# Patient Record
Sex: Female | Born: 1950 | Race: White | Hispanic: No | Marital: Married | Smoking: Never smoker
Health system: Southern US, Community
[De-identification: ages and names within clinical notes are randomized; demographics above are authoritative.]

## PROBLEM LIST (undated history)

## (undated) DIAGNOSIS — A4902 Methicillin resistant Staphylococcus aureus infection, unspecified site: Secondary | ICD-10-CM

## (undated) HISTORY — PX: BACK SURGERY: SHX140

## (undated) HISTORY — PX: ABDOMINAL HYSTERECTOMY: SHX81

---

## 1999-11-01 ENCOUNTER — Other Ambulatory Visit: Admission: RE | Admit: 1999-11-01 | Discharge: 1999-11-01 | Payer: Self-pay | Admitting: Obstetrics and Gynecology

## 2002-04-10 ENCOUNTER — Ambulatory Visit (HOSPITAL_COMMUNITY): Admission: RE | Admit: 2002-04-10 | Discharge: 2002-04-10 | Payer: Self-pay | Admitting: Gastroenterology

## 2003-10-21 ENCOUNTER — Other Ambulatory Visit: Admission: RE | Admit: 2003-10-21 | Discharge: 2003-10-21 | Payer: Self-pay | Admitting: Radiology

## 2007-12-29 ENCOUNTER — Encounter: Admission: RE | Admit: 2007-12-29 | Discharge: 2007-12-29 | Payer: Self-pay | Admitting: Neurosurgery

## 2010-04-21 ENCOUNTER — Encounter: Admission: RE | Admit: 2010-04-21 | Discharge: 2010-04-21 | Payer: Self-pay | Admitting: Internal Medicine

## 2010-09-26 ENCOUNTER — Other Ambulatory Visit (HOSPITAL_COMMUNITY): Payer: Self-pay | Admitting: Orthopaedic Surgery

## 2010-09-26 ENCOUNTER — Ambulatory Visit (HOSPITAL_COMMUNITY)
Admission: RE | Admit: 2010-09-26 | Discharge: 2010-09-26 | Disposition: A | Discharge status: Home / Self Care | Payer: BC Managed Care – PPO | Source: Ambulatory Visit | Attending: Orthopaedic Surgery | Admitting: Orthopaedic Surgery

## 2010-09-26 DIAGNOSIS — M545 Low back pain, unspecified: Secondary | ICD-10-CM

## 2010-09-26 DIAGNOSIS — M5126 Other intervertebral disc displacement, lumbar region: Secondary | ICD-10-CM | POA: Insufficient documentation

## 2010-09-26 DIAGNOSIS — M79609 Pain in unspecified limb: Secondary | ICD-10-CM | POA: Insufficient documentation

## 2010-10-19 ENCOUNTER — Encounter (HOSPITAL_COMMUNITY)
Admission: RE | Admit: 2010-10-19 | Discharge: 2010-10-19 | Disposition: A | Discharge status: Home / Self Care | Payer: BC Managed Care – PPO | Source: Ambulatory Visit | Attending: Neurological Surgery | Admitting: Neurological Surgery

## 2010-10-19 LAB — CBC
Hemoglobin: 13.6 g/dL (ref 12.0–15.0)
MCH: 30.4 pg (ref 26.0–34.0)
MCHC: 33.7 g/dL (ref 30.0–36.0)
MCV: 90 fL (ref 78.0–100.0)
RBC: 4.48 MIL/uL (ref 3.87–5.11)

## 2010-10-19 LAB — BASIC METABOLIC PANEL
BUN: 11 mg/dL (ref 6–23)
CO2: 32 mEq/L (ref 19–32)
Calcium: 10.6 mg/dL — ABNORMAL HIGH (ref 8.4–10.5)
Chloride: 102 mEq/L (ref 96–112)
Creatinine, Ser: 0.77 mg/dL (ref 0.4–1.2)
Glucose, Bld: 145 mg/dL — ABNORMAL HIGH (ref 70–99)

## 2010-10-20 ENCOUNTER — Ambulatory Visit (HOSPITAL_COMMUNITY)
Admission: RE | Admit: 2010-10-20 | Discharge: 2010-10-20 | Disposition: A | Discharge status: Home / Self Care | Payer: BC Managed Care – PPO | Source: Ambulatory Visit | Attending: Neurological Surgery | Admitting: Neurological Surgery

## 2010-10-20 ENCOUNTER — Inpatient Hospital Stay (HOSPITAL_COMMUNITY): Payer: BC Managed Care – PPO

## 2010-10-20 DIAGNOSIS — M5126 Other intervertebral disc displacement, lumbar region: Secondary | ICD-10-CM | POA: Insufficient documentation

## 2010-10-20 DIAGNOSIS — F411 Generalized anxiety disorder: Secondary | ICD-10-CM | POA: Insufficient documentation

## 2010-10-20 DIAGNOSIS — Z01818 Encounter for other preprocedural examination: Secondary | ICD-10-CM | POA: Insufficient documentation

## 2010-10-20 DIAGNOSIS — Z01812 Encounter for preprocedural laboratory examination: Secondary | ICD-10-CM | POA: Insufficient documentation

## 2010-10-25 NOTE — Op Note (Signed)
NAMEJENSINE, Hannah Salazar                    ACCOUNT NO.:  [id number]  MEDICAL RECORD NO.:  [id number]          PATIENT TYPE:  LOCATION:                                 FACILITY:  PHYSICIAN:  Stefani Dama, M.D.       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  PROCEDURE:  Microdiskectomy L5-S1 left with operating microscope microdissection technique.  SURGEON:  Stefani Dama, MD  FIRST ASSISTANT:  Cristi Loron, MD  ANESTHESIA:  General endotracheal.  INDICATIONS:  Hannah Salazar is a 60 year old individual who has had significant back and left lower extremity pain having failed efforts at conservative management over the past couple of months.  She was advised regarding surgical microdiskectomy.  Because the pain has been unrelenting, is bothering her lifestyle.  Substantially, she agreed with proceeding with microdiskectomy.  PROCEDURE:  The patient was brought to the operating room supine on the stretcher.  After smooth induction of general endotracheal anesthesia, she was turned prone.  The back was prepped with alcohol and DuraPrep, and draped in sterile fashion.  X-ray localization of L5-S1 was obtained first with needle placement, and a second x-ray confirmed placement when the dissection was taken down to the interlaminar space.  Laminotomy was performed removing the inferior margin lamina of L5 out to the medial wall facet.  Yellow ligament was taken up in this area.  The common dural tube was then identified, and the takeoff of the S1 nerve was noted be bowed substantially over significant mass.  When this was gradually mobilized, mass was noted to be the nerve root of S1.  This could be mobilized off of  mass, which was in fact a fragment of disk material.  Further dissection yielded, a singular fragment of disk material in the epidural space.  There was some contiguity with an opening into the intradiskal space.  However, this was fairly well fibrosed over.  Area was probed and no other fragments of disk could be identified in this region, and the path for the S1 nerve root was well decompressed.  The nerve root lied flat over the foraminal area and over the inter-disk space once the disk fragment was removed.  Hemostasis was carefully then checked.  The area was inspected for any other disk material or areas of stenosis.  None were identified and at this point, wound was irrigated copiously with antibiotic irrigating solution, and the lumbodorsal fascia was closed with #1 Vicryl in interrupted fashion, 2-0 Vicryl was used in subcutaneous tissues.  20 mL of 0.5% Marcaine was injected into the paraspinous musculature and fascia at this point, and then the subcuticular tissue was closed with 3-0 Vicryl inverted interrupted fashion.  The patient tolerated the procedure well.  Blood loss was essentially nil.     Stefani Dama, M.D.     [name]  D:  10/20/2010  T:  10/21/2010  Job:  [id number]  Electronically Signed by Barnett Abu M.D. on 10/25/2010 09:31:11 AM

## 2010-12-30 NOTE — Op Note (Signed)
NAME:  Hannah Salazar, Hannah Salazar                              ACCOUNT NO.:  [id number]   MEDICAL RECORD NO.:  [id number]                   PATIENT TYPE:  AMB   LOCATION:  ENDO                                 FACILITY:  [facility]   PHYSICIAN:  Charna Elizabeth, M.D.                   DATE OF BIRTH:  [birth year]   DATE OF PROCEDURE:  04/10/2002  DATE OF DISCHARGE:                                 OPERATIVE REPORT   PROCEDURE:  Screening colonoscopy.   ENDOSCOPIST:  Charna Elizabeth, M.D.   INSTRUMENT USED:  Olympus video colonoscope.   INDICATIONS:  Occasional BRBPR with recent change in bowel habits and  worsening constipation in a 60 year old white female.  Rule out colonic  polyps, masses, hemorrhoids, etc.   PREPROCEDURE PREPARATION:  Informed consent was procured from the patient.  The patient had fasted for eight hours prior to the procedure and was  prepped with a bottle of magnesium citrate and a gallon of NuLytely the  night prior to the procedure.   PREPROCEDURE PHYSICAL:  VITAL SIGNS:  The patient had stable vital signs.  NECK:  Supple.  CHEST:  Clear to auscultation.  S1, S2 regular.  ABDOMEN:  Soft with normal bowel sounds.   DESCRIPTION OF PROCEDURE:  The patient was placed in the left lateral  decubitus position and sedated with 60 mg of Demerol and 6 mg of Versed  intravenously.  Once the patient was adequately sedate and maintained on low-  flow oxygen and continuous cardiac monitoring, the Olympus video colonoscope  was advanced from the rectum to the cecum and terminal ileum without  difficulty.  The entire colonic mucosa and the terminal ileum appeared  normal with no evidence of polyps, masses, erosions, ulcerations, or  diverticular disease.  The appendiceal orifice and the ileocecal valve were  clearly visualized and photographed.  A small external hemorrhoid was seen  on withdrawal of the scope.  Retroflexion revealed no evidence of internal  hemorrhoids or rectal lesions.   IMPRESSION:  1. Small external hemorrhoid.  2. Otherwise normal colonoscopy up to the terminal ileum.   RECOMMENDATIONS:  1. A high-fiber diet with liberal fluid intake has been advocated; 15-20 g     of fiber in the diet has been advised.  2.     Outpatient follow-up in the next two weeks or earlier if need be.  3. Repeat colorectal cancer screening in the next five years unless the     patient develops any abnormal symptoms in the interim.                                               Charna Elizabeth, M.D.    JM/MEDQ  D:  04/10/2002  T:  04/14/2002  Job:  [id number]   cc:   Heather Roberts, M.D.

## 2011-04-03 ENCOUNTER — Other Ambulatory Visit: Payer: Self-pay | Admitting: Specialist

## 2011-04-03 DIAGNOSIS — Z1231 Encounter for screening mammogram for malignant neoplasm of breast: Secondary | ICD-10-CM

## 2011-04-27 ENCOUNTER — Other Ambulatory Visit: Payer: Self-pay | Admitting: Internal Medicine

## 2011-04-27 ENCOUNTER — Ambulatory Visit
Admission: RE | Admit: 2011-04-27 | Discharge: 2011-04-27 | Disposition: A | Discharge status: Home / Self Care | Payer: BC Managed Care – PPO | Source: Ambulatory Visit | Attending: Specialist | Admitting: Specialist

## 2011-04-27 DIAGNOSIS — Z1231 Encounter for screening mammogram for malignant neoplasm of breast: Secondary | ICD-10-CM

## 2012-04-22 ENCOUNTER — Other Ambulatory Visit: Payer: Self-pay | Admitting: Internal Medicine

## 2012-04-30 ENCOUNTER — Other Ambulatory Visit: Payer: Self-pay | Admitting: Internal Medicine

## 2012-04-30 DIAGNOSIS — Z1231 Encounter for screening mammogram for malignant neoplasm of breast: Secondary | ICD-10-CM

## 2012-05-20 ENCOUNTER — Ambulatory Visit
Admission: RE | Admit: 2012-05-20 | Discharge: 2012-05-20 | Disposition: A | Discharge status: Home / Self Care | Payer: BC Managed Care – PPO | Source: Ambulatory Visit | Attending: Internal Medicine | Admitting: Internal Medicine

## 2012-05-20 DIAGNOSIS — Z1231 Encounter for screening mammogram for malignant neoplasm of breast: Secondary | ICD-10-CM

## 2013-05-14 ENCOUNTER — Other Ambulatory Visit: Payer: Self-pay

## 2013-05-14 DIAGNOSIS — Z1231 Encounter for screening mammogram for malignant neoplasm of breast: Secondary | ICD-10-CM

## 2013-05-27 ENCOUNTER — Ambulatory Visit
Admission: RE | Admit: 2013-05-27 | Discharge: 2013-05-27 | Disposition: A | Discharge status: Home / Self Care | Payer: PRIVATE HEALTH INSURANCE | Source: Ambulatory Visit

## 2013-05-27 DIAGNOSIS — Z1231 Encounter for screening mammogram for malignant neoplasm of breast: Secondary | ICD-10-CM

## 2013-12-29 ENCOUNTER — Ambulatory Visit (HOSPITAL_COMMUNITY)
Admission: RE | Admit: 2013-12-29 | Discharge: 2013-12-29 | Disposition: A | Discharge status: Home / Self Care | Payer: PRIVATE HEALTH INSURANCE | Source: Ambulatory Visit | Attending: Surgery | Admitting: Surgery

## 2013-12-29 ENCOUNTER — Other Ambulatory Visit (HOSPITAL_COMMUNITY): Payer: Self-pay | Admitting: Internal Medicine

## 2013-12-29 DIAGNOSIS — M7989 Other specified soft tissue disorders: Secondary | ICD-10-CM

## 2013-12-29 NOTE — Progress Notes (Signed)
Preliminary results given to [facility] at [facility] via phone.

## 2014-04-28 ENCOUNTER — Other Ambulatory Visit: Payer: Self-pay

## 2014-04-28 DIAGNOSIS — Z1231 Encounter for screening mammogram for malignant neoplasm of breast: Secondary | ICD-10-CM

## 2014-05-15 DIAGNOSIS — M25511 Pain in right shoulder: Secondary | ICD-10-CM | POA: Diagnosis not present

## 2014-05-15 DIAGNOSIS — M7501 Adhesive capsulitis of right shoulder: Secondary | ICD-10-CM | POA: Diagnosis not present

## 2014-05-21 DIAGNOSIS — M25511 Pain in right shoulder: Secondary | ICD-10-CM | POA: Diagnosis not present

## 2014-05-21 DIAGNOSIS — M7501 Adhesive capsulitis of right shoulder: Secondary | ICD-10-CM | POA: Diagnosis not present

## 2014-05-25 DIAGNOSIS — M25511 Pain in right shoulder: Secondary | ICD-10-CM | POA: Diagnosis not present

## 2014-05-25 DIAGNOSIS — M7501 Adhesive capsulitis of right shoulder: Secondary | ICD-10-CM | POA: Diagnosis not present

## 2014-06-03 DIAGNOSIS — M7501 Adhesive capsulitis of right shoulder: Secondary | ICD-10-CM | POA: Diagnosis not present

## 2014-06-03 DIAGNOSIS — M25511 Pain in right shoulder: Secondary | ICD-10-CM | POA: Diagnosis not present

## 2014-06-05 ENCOUNTER — Ambulatory Visit
Admission: RE | Admit: 2014-06-05 | Discharge: 2014-06-05 | Disposition: A | Discharge status: Home / Self Care | Payer: Medicare Other | Source: Ambulatory Visit

## 2014-06-05 DIAGNOSIS — Z1231 Encounter for screening mammogram for malignant neoplasm of breast: Secondary | ICD-10-CM

## 2014-06-09 DIAGNOSIS — Z23 Encounter for immunization: Secondary | ICD-10-CM | POA: Diagnosis not present

## 2014-06-22 DIAGNOSIS — L821 Other seborrheic keratosis: Secondary | ICD-10-CM | POA: Diagnosis not present

## 2014-06-22 DIAGNOSIS — M7501 Adhesive capsulitis of right shoulder: Secondary | ICD-10-CM | POA: Diagnosis not present

## 2014-06-22 DIAGNOSIS — M25511 Pain in right shoulder: Secondary | ICD-10-CM | POA: Diagnosis not present

## 2014-06-22 DIAGNOSIS — D225 Melanocytic nevi of trunk: Secondary | ICD-10-CM | POA: Diagnosis not present

## 2014-06-22 DIAGNOSIS — D485 Neoplasm of uncertain behavior of skin: Secondary | ICD-10-CM | POA: Diagnosis not present

## 2014-08-11 DIAGNOSIS — I788 Other diseases of capillaries: Secondary | ICD-10-CM | POA: Diagnosis not present

## 2014-08-11 DIAGNOSIS — L718 Other rosacea: Secondary | ICD-10-CM | POA: Diagnosis not present

## 2014-12-04 DIAGNOSIS — R32 Unspecified urinary incontinence: Secondary | ICD-10-CM | POA: Diagnosis not present

## 2014-12-04 DIAGNOSIS — E785 Hyperlipidemia, unspecified: Secondary | ICD-10-CM | POA: Diagnosis not present

## 2014-12-04 DIAGNOSIS — I341 Nonrheumatic mitral (valve) prolapse: Secondary | ICD-10-CM | POA: Diagnosis not present

## 2014-12-04 DIAGNOSIS — M47817 Spondylosis without myelopathy or radiculopathy, lumbosacral region: Secondary | ICD-10-CM | POA: Diagnosis not present

## 2014-12-04 DIAGNOSIS — Z6823 Body mass index (BMI) 23.0-23.9, adult: Secondary | ICD-10-CM | POA: Diagnosis not present

## 2015-02-02 DIAGNOSIS — L820 Inflamed seborrheic keratosis: Secondary | ICD-10-CM | POA: Diagnosis not present

## 2015-02-26 DIAGNOSIS — E785 Hyperlipidemia, unspecified: Secondary | ICD-10-CM | POA: Diagnosis not present

## 2015-02-26 DIAGNOSIS — R8290 Unspecified abnormal findings in urine: Secondary | ICD-10-CM | POA: Diagnosis not present

## 2015-02-26 DIAGNOSIS — N390 Urinary tract infection, site not specified: Secondary | ICD-10-CM | POA: Diagnosis not present

## 2015-03-02 DIAGNOSIS — M47817 Spondylosis without myelopathy or radiculopathy, lumbosacral region: Secondary | ICD-10-CM | POA: Diagnosis not present

## 2015-03-02 DIAGNOSIS — Z961 Presence of intraocular lens: Secondary | ICD-10-CM | POA: Diagnosis not present

## 2015-03-02 DIAGNOSIS — Z0000 Encounter for general adult medical examination without abnormal findings: Secondary | ICD-10-CM | POA: Diagnosis not present

## 2015-03-02 DIAGNOSIS — Z23 Encounter for immunization: Secondary | ICD-10-CM | POA: Diagnosis not present

## 2015-03-02 DIAGNOSIS — I341 Nonrheumatic mitral (valve) prolapse: Secondary | ICD-10-CM | POA: Diagnosis not present

## 2015-03-02 DIAGNOSIS — E785 Hyperlipidemia, unspecified: Secondary | ICD-10-CM | POA: Diagnosis not present

## 2015-03-02 DIAGNOSIS — Z1212 Encounter for screening for malignant neoplasm of rectum: Secondary | ICD-10-CM | POA: Diagnosis not present

## 2015-05-17 ENCOUNTER — Other Ambulatory Visit: Payer: Self-pay

## 2015-05-17 DIAGNOSIS — Z1231 Encounter for screening mammogram for malignant neoplasm of breast: Secondary | ICD-10-CM

## 2015-05-25 DIAGNOSIS — Z23 Encounter for immunization: Secondary | ICD-10-CM | POA: Diagnosis not present

## 2015-06-07 ENCOUNTER — Ambulatory Visit: Payer: Medicare Other

## 2015-06-15 DIAGNOSIS — D2211 Melanocytic nevi of right eyelid, including canthus: Secondary | ICD-10-CM | POA: Diagnosis not present

## 2015-06-15 DIAGNOSIS — L821 Other seborrheic keratosis: Secondary | ICD-10-CM | POA: Diagnosis not present

## 2015-06-15 DIAGNOSIS — D2261 Melanocytic nevi of right upper limb, including shoulder: Secondary | ICD-10-CM | POA: Diagnosis not present

## 2015-06-15 DIAGNOSIS — L820 Inflamed seborrheic keratosis: Secondary | ICD-10-CM | POA: Diagnosis not present

## 2015-06-15 DIAGNOSIS — D2372 Other benign neoplasm of skin of left lower limb, including hip: Secondary | ICD-10-CM | POA: Diagnosis not present

## 2015-06-15 DIAGNOSIS — D225 Melanocytic nevi of trunk: Secondary | ICD-10-CM | POA: Diagnosis not present

## 2015-06-15 DIAGNOSIS — D1801 Hemangioma of skin and subcutaneous tissue: Secondary | ICD-10-CM | POA: Diagnosis not present

## 2015-06-15 DIAGNOSIS — D2239 Melanocytic nevi of other parts of face: Secondary | ICD-10-CM | POA: Diagnosis not present

## 2015-07-02 ENCOUNTER — Ambulatory Visit: Payer: Medicare Other

## 2015-07-19 ENCOUNTER — Ambulatory Visit
Admission: RE | Admit: 2015-07-19 | Discharge: 2015-07-19 | Disposition: A | Discharge status: Home / Self Care | Payer: Medicare Other | Source: Ambulatory Visit

## 2015-07-19 DIAGNOSIS — Z1231 Encounter for screening mammogram for malignant neoplasm of breast: Secondary | ICD-10-CM

## 2015-10-18 DIAGNOSIS — H43811 Vitreous degeneration, right eye: Secondary | ICD-10-CM | POA: Diagnosis not present

## 2015-11-22 DIAGNOSIS — H43811 Vitreous degeneration, right eye: Secondary | ICD-10-CM | POA: Diagnosis not present

## 2015-11-28 DIAGNOSIS — S40811A Abrasion of right upper arm, initial encounter: Secondary | ICD-10-CM | POA: Diagnosis not present

## 2015-11-28 DIAGNOSIS — Z23 Encounter for immunization: Secondary | ICD-10-CM | POA: Diagnosis not present

## 2016-03-01 DIAGNOSIS — H5212 Myopia, left eye: Secondary | ICD-10-CM | POA: Diagnosis not present

## 2016-03-01 DIAGNOSIS — H26492 Other secondary cataract, left eye: Secondary | ICD-10-CM | POA: Diagnosis not present

## 2016-05-10 DIAGNOSIS — Z0000 Encounter for general adult medical examination without abnormal findings: Secondary | ICD-10-CM | POA: Diagnosis not present

## 2016-05-10 DIAGNOSIS — Z780 Asymptomatic menopausal state: Secondary | ICD-10-CM | POA: Diagnosis not present

## 2016-05-10 DIAGNOSIS — E784 Other hyperlipidemia: Secondary | ICD-10-CM | POA: Diagnosis not present

## 2016-05-19 DIAGNOSIS — M47817 Spondylosis without myelopathy or radiculopathy, lumbosacral region: Secondary | ICD-10-CM | POA: Diagnosis not present

## 2016-05-19 DIAGNOSIS — Z23 Encounter for immunization: Secondary | ICD-10-CM | POA: Diagnosis not present

## 2016-05-19 DIAGNOSIS — Z0000 Encounter for general adult medical examination without abnormal findings: Secondary | ICD-10-CM | POA: Diagnosis not present

## 2016-05-19 DIAGNOSIS — Z6822 Body mass index (BMI) 22.0-22.9, adult: Secondary | ICD-10-CM | POA: Diagnosis not present

## 2016-05-19 DIAGNOSIS — E784 Other hyperlipidemia: Secondary | ICD-10-CM | POA: Diagnosis not present

## 2016-05-19 DIAGNOSIS — R32 Unspecified urinary incontinence: Secondary | ICD-10-CM | POA: Diagnosis not present

## 2016-06-06 DIAGNOSIS — Z23 Encounter for immunization: Secondary | ICD-10-CM | POA: Diagnosis not present

## 2016-06-13 ENCOUNTER — Other Ambulatory Visit: Payer: Self-pay | Admitting: Internal Medicine

## 2016-06-13 DIAGNOSIS — Z1231 Encounter for screening mammogram for malignant neoplasm of breast: Secondary | ICD-10-CM

## 2016-07-04 DIAGNOSIS — Z23 Encounter for immunization: Secondary | ICD-10-CM | POA: Diagnosis not present

## 2016-07-21 ENCOUNTER — Ambulatory Visit
Admission: RE | Admit: 2016-07-21 | Discharge: 2016-07-21 | Disposition: A | Discharge status: Home / Self Care | Payer: Medicare Other | Source: Ambulatory Visit | Attending: Internal Medicine | Admitting: Internal Medicine

## 2016-07-21 DIAGNOSIS — Z1231 Encounter for screening mammogram for malignant neoplasm of breast: Secondary | ICD-10-CM | POA: Diagnosis not present

## 2017-02-19 DIAGNOSIS — L821 Other seborrheic keratosis: Secondary | ICD-10-CM | POA: Diagnosis not present

## 2017-02-19 DIAGNOSIS — L918 Other hypertrophic disorders of the skin: Secondary | ICD-10-CM | POA: Diagnosis not present

## 2017-02-19 DIAGNOSIS — D1801 Hemangioma of skin and subcutaneous tissue: Secondary | ICD-10-CM | POA: Diagnosis not present

## 2017-02-19 DIAGNOSIS — D2261 Melanocytic nevi of right upper limb, including shoulder: Secondary | ICD-10-CM | POA: Diagnosis not present

## 2017-02-19 DIAGNOSIS — L819 Disorder of pigmentation, unspecified: Secondary | ICD-10-CM | POA: Diagnosis not present

## 2017-02-19 DIAGNOSIS — D225 Melanocytic nevi of trunk: Secondary | ICD-10-CM | POA: Diagnosis not present

## 2017-02-19 DIAGNOSIS — D2372 Other benign neoplasm of skin of left lower limb, including hip: Secondary | ICD-10-CM | POA: Diagnosis not present

## 2017-04-25 DIAGNOSIS — H00015 Hordeolum externum left lower eyelid: Secondary | ICD-10-CM | POA: Diagnosis not present

## 2017-05-03 DIAGNOSIS — H01001 Unspecified blepharitis right upper eyelid: Secondary | ICD-10-CM | POA: Diagnosis not present

## 2017-05-03 DIAGNOSIS — H0015 Chalazion left lower eyelid: Secondary | ICD-10-CM | POA: Diagnosis not present

## 2017-05-03 DIAGNOSIS — H01004 Unspecified blepharitis left upper eyelid: Secondary | ICD-10-CM | POA: Diagnosis not present

## 2017-05-11 DIAGNOSIS — Z23 Encounter for immunization: Secondary | ICD-10-CM | POA: Diagnosis not present

## 2017-05-17 DIAGNOSIS — Z0000 Encounter for general adult medical examination without abnormal findings: Secondary | ICD-10-CM | POA: Diagnosis not present

## 2017-05-17 DIAGNOSIS — E7849 Other hyperlipidemia: Secondary | ICD-10-CM | POA: Diagnosis not present

## 2017-05-21 DIAGNOSIS — H0014 Chalazion left upper eyelid: Secondary | ICD-10-CM | POA: Diagnosis not present

## 2017-05-21 DIAGNOSIS — H0015 Chalazion left lower eyelid: Secondary | ICD-10-CM | POA: Diagnosis not present

## 2017-05-23 DIAGNOSIS — Z0000 Encounter for general adult medical examination without abnormal findings: Secondary | ICD-10-CM | POA: Diagnosis not present

## 2017-05-23 DIAGNOSIS — R600 Localized edema: Secondary | ICD-10-CM | POA: Diagnosis not present

## 2017-05-23 DIAGNOSIS — Z1389 Encounter for screening for other disorder: Secondary | ICD-10-CM | POA: Diagnosis not present

## 2017-05-23 DIAGNOSIS — M47817 Spondylosis without myelopathy or radiculopathy, lumbosacral region: Secondary | ICD-10-CM | POA: Diagnosis not present

## 2017-05-23 DIAGNOSIS — Z6822 Body mass index (BMI) 22.0-22.9, adult: Secondary | ICD-10-CM | POA: Diagnosis not present

## 2017-05-23 DIAGNOSIS — H0016 Chalazion left eye, unspecified eyelid: Secondary | ICD-10-CM | POA: Diagnosis not present

## 2017-05-23 DIAGNOSIS — E7849 Other hyperlipidemia: Secondary | ICD-10-CM | POA: Diagnosis not present

## 2017-05-23 DIAGNOSIS — I341 Nonrheumatic mitral (valve) prolapse: Secondary | ICD-10-CM | POA: Diagnosis not present

## 2017-06-01 DIAGNOSIS — Z1212 Encounter for screening for malignant neoplasm of rectum: Secondary | ICD-10-CM | POA: Diagnosis not present

## 2017-06-11 ENCOUNTER — Other Ambulatory Visit: Payer: Self-pay | Admitting: Internal Medicine

## 2017-06-11 DIAGNOSIS — Z1231 Encounter for screening mammogram for malignant neoplasm of breast: Secondary | ICD-10-CM

## 2017-06-19 DIAGNOSIS — H26492 Other secondary cataract, left eye: Secondary | ICD-10-CM | POA: Diagnosis not present

## 2017-07-02 ENCOUNTER — Other Ambulatory Visit: Payer: Self-pay | Admitting: Internal Medicine

## 2017-07-02 DIAGNOSIS — M810 Age-related osteoporosis without current pathological fracture: Secondary | ICD-10-CM

## 2017-07-02 DIAGNOSIS — R5381 Other malaise: Secondary | ICD-10-CM

## 2017-07-02 DIAGNOSIS — Z13820 Encounter for screening for osteoporosis: Secondary | ICD-10-CM

## 2017-07-27 ENCOUNTER — Ambulatory Visit: Payer: Medicare Other

## 2017-08-01 ENCOUNTER — Ambulatory Visit
Admission: RE | Admit: 2017-08-01 | Discharge: 2017-08-01 | Disposition: A | Discharge status: Home / Self Care | Payer: Medicare Other | Source: Ambulatory Visit | Attending: Internal Medicine | Admitting: Internal Medicine

## 2017-08-01 DIAGNOSIS — M810 Age-related osteoporosis without current pathological fracture: Secondary | ICD-10-CM

## 2017-08-01 DIAGNOSIS — M8588 Other specified disorders of bone density and structure, other site: Secondary | ICD-10-CM | POA: Diagnosis not present

## 2017-08-01 DIAGNOSIS — Z780 Asymptomatic menopausal state: Secondary | ICD-10-CM | POA: Diagnosis not present

## 2017-08-01 DIAGNOSIS — Z1231 Encounter for screening mammogram for malignant neoplasm of breast: Secondary | ICD-10-CM | POA: Diagnosis not present

## 2017-08-02 DIAGNOSIS — H26492 Other secondary cataract, left eye: Secondary | ICD-10-CM | POA: Diagnosis not present

## 2017-09-09 DIAGNOSIS — H0011 Chalazion right upper eyelid: Secondary | ICD-10-CM | POA: Diagnosis not present

## 2017-09-09 DIAGNOSIS — H5711 Ocular pain, right eye: Secondary | ICD-10-CM | POA: Diagnosis not present

## 2017-09-09 DIAGNOSIS — L03213 Periorbital cellulitis: Secondary | ICD-10-CM | POA: Diagnosis not present

## 2017-09-10 DIAGNOSIS — H0011 Chalazion right upper eyelid: Secondary | ICD-10-CM | POA: Diagnosis not present

## 2017-09-10 DIAGNOSIS — L03213 Periorbital cellulitis: Secondary | ICD-10-CM | POA: Diagnosis not present

## 2018-02-19 ENCOUNTER — Ambulatory Visit (INDEPENDENT_AMBULATORY_CARE_PROVIDER_SITE_OTHER): Payer: Medicare Other

## 2018-02-19 ENCOUNTER — Encounter (INDEPENDENT_AMBULATORY_CARE_PROVIDER_SITE_OTHER): Payer: Self-pay | Admitting: Orthopaedic Surgery

## 2018-02-19 ENCOUNTER — Ambulatory Visit (INDEPENDENT_AMBULATORY_CARE_PROVIDER_SITE_OTHER): Payer: Medicare Other | Admitting: Orthopaedic Surgery

## 2018-02-19 VITALS — BP 120/67 | HR 63 | Ht 69.5 in | Wt 150.0 lb

## 2018-02-19 DIAGNOSIS — M7061 Trochanteric bursitis, right hip: Secondary | ICD-10-CM

## 2018-02-19 DIAGNOSIS — M25551 Pain in right hip: Secondary | ICD-10-CM

## 2018-02-19 NOTE — Progress Notes (Signed)
Office Visit Note   Patient: Hannah Salazar           Date of Birth: 02/03/1951           MRN: [id number] Visit Date: 02/19/2018              Requested by: Hannah Bunting, MD [address] [place], [place] [address] PCP: Hannah Bunting, MD   Assessment & Plan: Visit Diagnoses:  1. Pain in right hip   2. Trochanteric bursitis, right hip     Plan: Greater trochanteric bursitis right hip.  Long discussion regarding treatment options.  Presently minimally symptomatic.  Can use ice or heat and over-the-counter NSAIDs.  Consider cortisone injection if there is an exacerbation.  I do not think this is referred from her lumbar spine  Follow-Up Instructions: Return if symptoms worsen or fail to improve.   Orders:  Orders Placed This Encounter  Procedures  . XR Pelvis 1-2 Views   No orders of the defined types were placed in this encounter.     Procedures: No procedures performed   Clinical Data: No additional findings.   Subjective: Chief Complaint  Patient presents with  . New Patient (Initial Visit)    R HIP PAIN THINKS IT MAY BE BURSITIS HAS HAD PRIOR, THIS TIME SX HAVE BEEN GOING ON FOR 6-8 WKS OFF AND ON NAD HAS SOME LEFT HIP PAIN OCC  Hannah Salazar relates that she is been having some pain along the lateral aspect of her right hip over the past 6 to 8 weeks.  She denies any injury or trauma.  Pain originates in the lateral aspect of her hip and radiates along the lateral thigh as far distally as the knee.  She has not experienced any groin pain.  Is not been any numbness or tingling.  She does have a history of problems with her lumbar spine.  Many years ago she had a discectomy at L5-S1 on the left by Dr. Ellene Salazar.  He did have an MRI scan in 2012 demonstrating some bulging disks at other levels.  HPI  Review of Systems  Constitutional: Negative for fatigue and fever.  HENT: Negative for ear pain.   Eyes: Negative for pain.  Respiratory: Negative for cough and shortness of  breath.   Cardiovascular: Positive for leg swelling.  Gastrointestinal: Negative for constipation and diarrhea.  Genitourinary: Negative for difficulty urinating.  Musculoskeletal: Negative for back pain and neck pain.  Skin: Negative for rash.  Allergic/Immunologic: Negative for food allergies.  Neurological: Positive for weakness. Negative for numbness.  Hematological: Does not bruise/bleed easily.  Psychiatric/Behavioral: Positive for sleep disturbance.     Objective: Vital Signs: BP 120/67 (BP Location: Right Arm, Patient Position: Sitting, Cuff Size: Normal)   Pulse 63   Ht 5' 9.5" (1.765 m)   Wt 150 lb (68 kg)   BMI 21.83 kg/m   Physical Exam  Constitutional: She is oriented to person, place, and time. She appears well-developed and well-nourished.  HENT:  Mouth/Throat: Oropharynx is clear and moist.  Eyes: Pupils are equal, round, and reactive to light. EOM are normal.  Pulmonary/Chest: Effort normal.  Neurological: She is alert and oriented to person, place, and time.  Skin: Skin is warm and dry.  Psychiatric: She has a normal mood and affect. Her behavior is normal.    Ortho Exam awake alert and oriented x3.  Comfortable sitting.  Straight leg raise negative bilaterally.  No pain over the greater trochanter of the right hip.  No pain over the greater trochanter on the left.  Painless range of motion of both hips.  No percussible back pain.  Walks without a limp.  No groin discomfort skin intact.  Good pulses.  No distal edema  Specialty Comments:  No specialty comments available.  Imaging: Xr Pelvis 1-2 Views  Result Date: 02/19/2018 AP the pelvis demonstrates some irregularity along the greater trochanter of the right greater than left hip.  This could be consistent with a bursitis.  the hip joints appear to be well maintained.  Some calcification along the lateral aspect of the acetabulum but no obvious osteoarthritis.    PMFS History: There are no active  problems to display for this patient.  History reviewed. No pertinent past medical history.  History reviewed. No pertinent family history.  Past Surgical History:  Procedure Laterality Date  . ABDOMINAL HYSTERECTOMY    . BACK SURGERY     Social History   Occupational History  . Not on file  Tobacco Use  . Smoking status: Never Smoker  . Smokeless tobacco: Never Used  Substance and Sexual Activity  . Alcohol use: Yes  . Drug use: Not Currently  . Sexual activity: Not on file

## 2018-04-02 DIAGNOSIS — L821 Other seborrheic keratosis: Secondary | ICD-10-CM | POA: Diagnosis not present

## 2018-04-02 DIAGNOSIS — D225 Melanocytic nevi of trunk: Secondary | ICD-10-CM | POA: Diagnosis not present

## 2018-04-02 DIAGNOSIS — D2261 Melanocytic nevi of right upper limb, including shoulder: Secondary | ICD-10-CM | POA: Diagnosis not present

## 2018-04-02 DIAGNOSIS — D2272 Melanocytic nevi of left lower limb, including hip: Secondary | ICD-10-CM | POA: Diagnosis not present

## 2018-04-02 DIAGNOSIS — D2239 Melanocytic nevi of other parts of face: Secondary | ICD-10-CM | POA: Diagnosis not present

## 2018-04-02 DIAGNOSIS — D1801 Hemangioma of skin and subcutaneous tissue: Secondary | ICD-10-CM | POA: Diagnosis not present

## 2018-04-02 DIAGNOSIS — C44319 Basal cell carcinoma of skin of other parts of face: Secondary | ICD-10-CM | POA: Diagnosis not present

## 2018-04-18 DIAGNOSIS — H5711 Ocular pain, right eye: Secondary | ICD-10-CM | POA: Diagnosis not present

## 2018-04-30 DIAGNOSIS — C44319 Basal cell carcinoma of skin of other parts of face: Secondary | ICD-10-CM | POA: Diagnosis not present

## 2018-04-30 DIAGNOSIS — Z85828 Personal history of other malignant neoplasm of skin: Secondary | ICD-10-CM | POA: Diagnosis not present

## 2018-05-13 DIAGNOSIS — Z6822 Body mass index (BMI) 22.0-22.9, adult: Secondary | ICD-10-CM | POA: Diagnosis not present

## 2018-05-13 DIAGNOSIS — J189 Pneumonia, unspecified organism: Secondary | ICD-10-CM | POA: Diagnosis not present

## 2018-05-13 DIAGNOSIS — R05 Cough: Secondary | ICD-10-CM | POA: Diagnosis not present

## 2018-05-13 DIAGNOSIS — J329 Chronic sinusitis, unspecified: Secondary | ICD-10-CM | POA: Diagnosis not present

## 2018-06-02 DIAGNOSIS — Z23 Encounter for immunization: Secondary | ICD-10-CM | POA: Diagnosis not present

## 2018-06-25 DIAGNOSIS — E7849 Other hyperlipidemia: Secondary | ICD-10-CM | POA: Diagnosis not present

## 2018-06-25 DIAGNOSIS — R82998 Other abnormal findings in urine: Secondary | ICD-10-CM | POA: Diagnosis not present

## 2018-06-27 DIAGNOSIS — Z0000 Encounter for general adult medical examination without abnormal findings: Secondary | ICD-10-CM | POA: Diagnosis not present

## 2018-06-27 DIAGNOSIS — Z6822 Body mass index (BMI) 22.0-22.9, adult: Secondary | ICD-10-CM | POA: Diagnosis not present

## 2018-06-27 DIAGNOSIS — K589 Irritable bowel syndrome without diarrhea: Secondary | ICD-10-CM | POA: Diagnosis not present

## 2018-06-27 DIAGNOSIS — R600 Localized edema: Secondary | ICD-10-CM | POA: Diagnosis not present

## 2018-06-27 DIAGNOSIS — I341 Nonrheumatic mitral (valve) prolapse: Secondary | ICD-10-CM | POA: Diagnosis not present

## 2018-06-27 DIAGNOSIS — M47817 Spondylosis without myelopathy or radiculopathy, lumbosacral region: Secondary | ICD-10-CM | POA: Diagnosis not present

## 2018-06-27 DIAGNOSIS — Z1389 Encounter for screening for other disorder: Secondary | ICD-10-CM | POA: Diagnosis not present

## 2018-06-27 DIAGNOSIS — R32 Unspecified urinary incontinence: Secondary | ICD-10-CM | POA: Diagnosis not present

## 2018-06-27 DIAGNOSIS — E7849 Other hyperlipidemia: Secondary | ICD-10-CM | POA: Diagnosis not present

## 2018-06-28 DIAGNOSIS — Z1212 Encounter for screening for malignant neoplasm of rectum: Secondary | ICD-10-CM | POA: Diagnosis not present

## 2018-07-03 ENCOUNTER — Other Ambulatory Visit: Payer: Self-pay | Admitting: Internal Medicine

## 2018-07-03 DIAGNOSIS — Z1231 Encounter for screening mammogram for malignant neoplasm of breast: Secondary | ICD-10-CM

## 2018-08-19 ENCOUNTER — Ambulatory Visit
Admission: RE | Admit: 2018-08-19 | Discharge: 2018-08-19 | Disposition: A | Discharge status: Home / Self Care | Payer: Medicare Other | Source: Ambulatory Visit | Attending: Internal Medicine | Admitting: Internal Medicine

## 2018-08-19 DIAGNOSIS — Z1231 Encounter for screening mammogram for malignant neoplasm of breast: Secondary | ICD-10-CM

## 2019-03-10 DIAGNOSIS — Z85828 Personal history of other malignant neoplasm of skin: Secondary | ICD-10-CM | POA: Diagnosis not present

## 2019-03-10 DIAGNOSIS — L570 Actinic keratosis: Secondary | ICD-10-CM | POA: Diagnosis not present

## 2019-03-10 DIAGNOSIS — D2261 Melanocytic nevi of right upper limb, including shoulder: Secondary | ICD-10-CM | POA: Diagnosis not present

## 2019-03-10 DIAGNOSIS — L821 Other seborrheic keratosis: Secondary | ICD-10-CM | POA: Diagnosis not present

## 2019-03-10 DIAGNOSIS — L700 Acne vulgaris: Secondary | ICD-10-CM | POA: Diagnosis not present

## 2019-04-14 DIAGNOSIS — L819 Disorder of pigmentation, unspecified: Secondary | ICD-10-CM | POA: Diagnosis not present

## 2019-04-14 DIAGNOSIS — L821 Other seborrheic keratosis: Secondary | ICD-10-CM | POA: Diagnosis not present

## 2019-04-14 DIAGNOSIS — D1801 Hemangioma of skin and subcutaneous tissue: Secondary | ICD-10-CM | POA: Diagnosis not present

## 2019-04-14 DIAGNOSIS — D225 Melanocytic nevi of trunk: Secondary | ICD-10-CM | POA: Diagnosis not present

## 2019-04-14 DIAGNOSIS — Z85828 Personal history of other malignant neoplasm of skin: Secondary | ICD-10-CM | POA: Diagnosis not present

## 2019-04-14 DIAGNOSIS — D2261 Melanocytic nevi of right upper limb, including shoulder: Secondary | ICD-10-CM | POA: Diagnosis not present

## 2019-05-01 DIAGNOSIS — Z23 Encounter for immunization: Secondary | ICD-10-CM | POA: Diagnosis not present

## 2019-06-05 DIAGNOSIS — N3941 Urge incontinence: Secondary | ICD-10-CM | POA: Diagnosis not present

## 2019-06-05 DIAGNOSIS — G479 Sleep disorder, unspecified: Secondary | ICD-10-CM | POA: Diagnosis not present

## 2019-06-05 DIAGNOSIS — Z23 Encounter for immunization: Secondary | ICD-10-CM | POA: Diagnosis not present

## 2019-06-05 DIAGNOSIS — R067 Sneezing: Secondary | ICD-10-CM | POA: Diagnosis not present

## 2019-06-05 DIAGNOSIS — Z13220 Encounter for screening for lipoid disorders: Secondary | ICD-10-CM | POA: Diagnosis not present

## 2019-06-05 DIAGNOSIS — Z79899 Other long term (current) drug therapy: Secondary | ICD-10-CM | POA: Diagnosis not present

## 2019-06-05 DIAGNOSIS — Z1231 Encounter for screening mammogram for malignant neoplasm of breast: Secondary | ICD-10-CM | POA: Diagnosis not present

## 2019-06-05 DIAGNOSIS — L719 Rosacea, unspecified: Secondary | ICD-10-CM | POA: Diagnosis not present

## 2019-06-05 DIAGNOSIS — R600 Localized edema: Secondary | ICD-10-CM | POA: Diagnosis not present

## 2019-06-05 DIAGNOSIS — R159 Full incontinence of feces: Secondary | ICD-10-CM | POA: Diagnosis not present

## 2019-06-05 DIAGNOSIS — M79673 Pain in unspecified foot: Secondary | ICD-10-CM | POA: Diagnosis not present

## 2019-06-05 DIAGNOSIS — N959 Unspecified menopausal and perimenopausal disorder: Secondary | ICD-10-CM | POA: Diagnosis not present

## 2019-06-05 DIAGNOSIS — M79671 Pain in right foot: Secondary | ICD-10-CM | POA: Diagnosis not present

## 2019-06-05 DIAGNOSIS — Z85828 Personal history of other malignant neoplasm of skin: Secondary | ICD-10-CM | POA: Diagnosis not present

## 2019-06-05 DIAGNOSIS — K5901 Slow transit constipation: Secondary | ICD-10-CM | POA: Diagnosis not present

## 2019-06-05 DIAGNOSIS — J309 Allergic rhinitis, unspecified: Secondary | ICD-10-CM | POA: Diagnosis not present

## 2019-06-05 DIAGNOSIS — M8580 Other specified disorders of bone density and structure, unspecified site: Secondary | ICD-10-CM | POA: Diagnosis not present

## 2019-06-05 DIAGNOSIS — M19041 Primary osteoarthritis, right hand: Secondary | ICD-10-CM | POA: Diagnosis not present

## 2019-06-05 DIAGNOSIS — M79672 Pain in left foot: Secondary | ICD-10-CM | POA: Diagnosis not present

## 2019-06-17 DIAGNOSIS — R159 Full incontinence of feces: Secondary | ICD-10-CM | POA: Diagnosis not present

## 2019-06-17 DIAGNOSIS — R278 Other lack of coordination: Secondary | ICD-10-CM | POA: Diagnosis not present

## 2019-06-19 DIAGNOSIS — J301 Allergic rhinitis due to pollen: Secondary | ICD-10-CM | POA: Diagnosis not present

## 2019-06-19 DIAGNOSIS — Z0182 Encounter for allergy testing: Secondary | ICD-10-CM | POA: Diagnosis not present

## 2019-06-19 DIAGNOSIS — Z880 Allergy status to penicillin: Secondary | ICD-10-CM | POA: Diagnosis not present

## 2019-06-19 DIAGNOSIS — H1013 Acute atopic conjunctivitis, bilateral: Secondary | ICD-10-CM | POA: Diagnosis not present

## 2019-07-07 DIAGNOSIS — R278 Other lack of coordination: Secondary | ICD-10-CM | POA: Diagnosis not present

## 2019-07-07 DIAGNOSIS — R159 Full incontinence of feces: Secondary | ICD-10-CM | POA: Diagnosis not present

## 2019-07-14 DIAGNOSIS — Z01818 Encounter for other preprocedural examination: Secondary | ICD-10-CM | POA: Diagnosis not present

## 2019-07-17 DIAGNOSIS — R159 Full incontinence of feces: Secondary | ICD-10-CM | POA: Diagnosis not present

## 2019-07-17 DIAGNOSIS — K640 First degree hemorrhoids: Secondary | ICD-10-CM | POA: Diagnosis not present

## 2019-07-17 DIAGNOSIS — R1084 Generalized abdominal pain: Secondary | ICD-10-CM | POA: Diagnosis not present

## 2019-07-17 DIAGNOSIS — D123 Benign neoplasm of transverse colon: Secondary | ICD-10-CM | POA: Diagnosis not present

## 2019-07-17 DIAGNOSIS — K5730 Diverticulosis of large intestine without perforation or abscess without bleeding: Secondary | ICD-10-CM | POA: Diagnosis not present

## 2019-07-17 DIAGNOSIS — K5700 Diverticulitis of small intestine with perforation and abscess without bleeding: Secondary | ICD-10-CM | POA: Diagnosis not present

## 2019-07-17 DIAGNOSIS — R140 Abdominal distension (gaseous): Secondary | ICD-10-CM | POA: Diagnosis not present

## 2019-08-25 DIAGNOSIS — N959 Unspecified menopausal and perimenopausal disorder: Secondary | ICD-10-CM | POA: Diagnosis not present

## 2019-08-25 DIAGNOSIS — Z1231 Encounter for screening mammogram for malignant neoplasm of breast: Secondary | ICD-10-CM | POA: Diagnosis not present

## 2019-08-25 DIAGNOSIS — M8580 Other specified disorders of bone density and structure, unspecified site: Secondary | ICD-10-CM | POA: Diagnosis not present

## 2019-08-28 DIAGNOSIS — T3795XA Adverse effect of unspecified systemic anti-infective and antiparasitic, initial encounter: Secondary | ICD-10-CM | POA: Diagnosis not present

## 2019-08-28 DIAGNOSIS — Z880 Allergy status to penicillin: Secondary | ICD-10-CM | POA: Diagnosis not present

## 2019-08-28 DIAGNOSIS — L270 Generalized skin eruption due to drugs and medicaments taken internally: Secondary | ICD-10-CM | POA: Diagnosis not present

## 2019-08-28 DIAGNOSIS — J310 Chronic rhinitis: Secondary | ICD-10-CM | POA: Diagnosis not present

## 2019-09-08 DIAGNOSIS — M810 Age-related osteoporosis without current pathological fracture: Secondary | ICD-10-CM | POA: Diagnosis not present

## 2019-09-15 DIAGNOSIS — M8580 Other specified disorders of bone density and structure, unspecified site: Secondary | ICD-10-CM | POA: Diagnosis not present

## 2019-09-15 DIAGNOSIS — M810 Age-related osteoporosis without current pathological fracture: Secondary | ICD-10-CM | POA: Diagnosis not present

## 2020-01-13 DIAGNOSIS — M810 Age-related osteoporosis without current pathological fracture: Secondary | ICD-10-CM | POA: Diagnosis not present

## 2020-01-18 DIAGNOSIS — M810 Age-related osteoporosis without current pathological fracture: Secondary | ICD-10-CM | POA: Diagnosis not present

## 2020-03-17 DIAGNOSIS — R140 Abdominal distension (gaseous): Secondary | ICD-10-CM | POA: Diagnosis not present

## 2020-03-17 DIAGNOSIS — K5900 Constipation, unspecified: Secondary | ICD-10-CM | POA: Diagnosis not present

## 2020-03-20 ENCOUNTER — Ambulatory Visit (HOSPITAL_COMMUNITY)
Admission: RE | Admit: 2020-03-20 | Discharge: 2020-03-20 | Disposition: A | Discharge status: Home / Self Care | Payer: Medicare Other | Source: Ambulatory Visit | Attending: Family Medicine | Admitting: Family Medicine

## 2020-03-20 ENCOUNTER — Encounter (HOSPITAL_COMMUNITY): Payer: Self-pay

## 2020-03-20 ENCOUNTER — Other Ambulatory Visit: Payer: Self-pay

## 2020-03-20 VITALS — BP 125/78 | HR 72 | Temp 98.7°F | Resp 16

## 2020-03-20 DIAGNOSIS — S161XXA Strain of muscle, fascia and tendon at neck level, initial encounter: Secondary | ICD-10-CM

## 2020-03-20 MED ORDER — BACLOFEN 10 MG PO TABS: 5.0000 mg | ORAL_TABLET | Freq: Two times a day (BID) | ORAL | 0 refills | Status: DC | PRN

## 2020-03-20 MED ORDER — KETOROLAC TROMETHAMINE 30 MG/ML IJ SOLN
30.0000 mg | Freq: Once | INTRAMUSCULAR | Status: AC
  Administered 2020-03-20: 30 mg via INTRAMUSCULAR

## 2020-03-20 MED ORDER — KETOROLAC TROMETHAMINE 30 MG/ML IJ SOLN
INTRAMUSCULAR | Status: AC
  Filled 2020-03-20: qty 1

## 2020-03-20 NOTE — ED Provider Notes (Signed)
[place]    CSN: [id number] Arrival date & time: 03/20/20  1641      History   Chief Complaint Chief Complaint  Patient presents with  . Neck Pain    HPI Hannah Salazar is a 69 y.o. female.   She is presenting with bilateral neck pain.  Has been severe in nature.  Extends up to her head.  Denies history of similar pain.  Has been doing work in a forward flexed position of the neck.  No neck surgery.  No fevers or chills.  HPI  History reviewed. No pertinent past medical history.  There are no problems to display for this patient.   Past Surgical History:  Procedure Laterality Date  . ABDOMINAL HYSTERECTOMY    . BACK SURGERY      OB History   No obstetric history on file.      Home Medications    Prior to Admission medications   Medication Sig Start Date End Date Taking? Authorizing Provider  baclofen (LIORESAL) 10 MG tablet Take 0.5 tablets (5 mg total) by mouth 2 (two) times daily as needed for muscle spasms. 03/20/20   Rosemarie Ax, MD  cetirizine (ZYRTEC) 10 MG chewable tablet Chew 10 mg by mouth daily.    [provider]  loratadine (CLARITIN) 10 MG tablet Take 10 mg by mouth daily.    [provider]  MYRBETRIQ 50 MG TB24 tablet Take 50 mg by mouth daily. 12/27/17   [provider]    Family History Family History  Problem Relation Age of Onset  . Breast cancer Neg Hx     Social History Social History   Tobacco Use  . Smoking status: Never Smoker  . Smokeless tobacco: Never Used  Vaping Use  . Vaping Use: Never used  Substance Use Topics  . Alcohol use: Yes  . Drug use: Not Currently     Allergies   Penicillins   Review of Systems Review of Systems  HPI  Physical Exam Triage Vital Signs ED Triage Vitals  Enc Vitals Group     BP 03/20/20 1709 125/78     Pulse Rate 03/20/20 1709 72     Resp 03/20/20 1709 16     Temp 03/20/20 1709 98.7 F (37.1 C)     Temp Source 03/20/20 1709 Oral     SpO2  03/20/20 1709 96 %     Weight --      Height --      Head Circumference --      Peak Flow --      Pain Score 03/20/20 1712 8     Pain Loc --      Pain Edu? --      Excl. in [place]? --    No data found.  Updated Vital Signs BP 125/78 (BP Location: Left Arm)   Pulse 72   Temp 98.7 F (37.1 C) (Oral)   Resp 16   SpO2 96%   Visual Acuity Right Eye Distance:   Left Eye Distance:   Bilateral Distance:    Right Eye Near:   Left Eye Near:    Bilateral Near:     Physical Exam Gen: NAD, alert, cooperative with exam, well-appearing ENT: normal lips, normal nasal mucosa,  Eye: normal EOM, normal conjunctiva and lids Neuro: normal tone, normal sensation to touch Psych:  normal insight, alert and oriented MSK:  Neck: Limited flexion and extension. Limited lateral rotation. No tenderness to palpation over the  paraspinal cervical muscles. Normal shrug Normal shoulder range of motion. Neurovascularly intact   UC Treatments / Results  Labs (all labs ordered are listed, but only abnormal results are displayed) Labs Reviewed - No data to display  EKG   Radiology No results found.  Procedures Procedures (including critical care time)  Medications Ordered in UC Medications  ketorolac (TORADOL) 30 MG/ML injection 30 mg (30 mg Intramuscular Given 03/20/20 1831)    Initial Impression / Assessment and Plan / UC Course  I have reviewed the triage vital signs and the nursing notes.  Pertinent labs & imaging results that were available during my care of the patient were reviewed by me and considered in my medical decision making (see chart for details).     Hannah Salazar is a 69 year old female is presenting with suggestion of cervical strain.  Likely muscle spasm contributing.  Seems less likely for infection and no related trauma.  Provided Toradol and baclofen.  Counseled on home exercise therapy and supportive care.  Given indications to follow-up return.  Final Clinical  Impressions(s) / UC Diagnoses   Final diagnoses:  Acute strain of neck muscle, initial encounter     Discharge Instructions     Please try heat  Please try ibuprofen, starting after midnight tonight  Please try the exercises  Please follow up if your symptoms fail to improve.     ED Prescriptions    Medication Sig Dispense Auth. Provider   baclofen (LIORESAL) 10 MG tablet Take 0.5 tablets (5 mg total) by mouth 2 (two) times daily as needed for muscle spasms. 20 each Rosemarie Ax, MD     PDMP not reviewed this encounter.   Rosemarie Ax, MD 03/20/20 [place]

## 2020-03-20 NOTE — Discharge Instructions (Signed)
Please try heat  Please try ibuprofen, starting after midnight tonight  Please try the exercises  Please follow up if your symptoms fail to improve.

## 2020-03-20 NOTE — ED Triage Notes (Signed)
Pt presents to UC for neck pain x4 days. Pt states she started new medication on 7/27 and possible side effect is swollen glands. Pt also had long telephone call where neck was in same position for 3 hrs on 8/3 and concerned for strained muscles as well.   Pt seen virtually by PCP and told to come in for exam.   Pt complaining of painful swallowing. Pt c/o pain in back of neck that radiates to front of neck, head, and shoulders.   Pt has been treating with 200 mg motrin q6 with out relief.

## 2020-04-26 DIAGNOSIS — L821 Other seborrheic keratosis: Secondary | ICD-10-CM | POA: Diagnosis not present

## 2020-04-26 DIAGNOSIS — L820 Inflamed seborrheic keratosis: Secondary | ICD-10-CM | POA: Diagnosis not present

## 2020-04-26 DIAGNOSIS — L570 Actinic keratosis: Secondary | ICD-10-CM | POA: Diagnosis not present

## 2020-04-26 DIAGNOSIS — D1801 Hemangioma of skin and subcutaneous tissue: Secondary | ICD-10-CM | POA: Diagnosis not present

## 2020-04-26 DIAGNOSIS — L814 Other melanin hyperpigmentation: Secondary | ICD-10-CM | POA: Diagnosis not present

## 2020-04-26 DIAGNOSIS — C44311 Basal cell carcinoma of skin of nose: Secondary | ICD-10-CM | POA: Diagnosis not present

## 2020-04-26 DIAGNOSIS — Z85828 Personal history of other malignant neoplasm of skin: Secondary | ICD-10-CM | POA: Diagnosis not present

## 2020-04-26 DIAGNOSIS — D2262 Melanocytic nevi of left upper limb, including shoulder: Secondary | ICD-10-CM | POA: Diagnosis not present

## 2020-04-26 DIAGNOSIS — L819 Disorder of pigmentation, unspecified: Secondary | ICD-10-CM | POA: Diagnosis not present

## 2020-04-26 DIAGNOSIS — D225 Melanocytic nevi of trunk: Secondary | ICD-10-CM | POA: Diagnosis not present

## 2020-04-26 DIAGNOSIS — L738 Other specified follicular disorders: Secondary | ICD-10-CM | POA: Diagnosis not present

## 2020-05-10 DIAGNOSIS — K5900 Constipation, unspecified: Secondary | ICD-10-CM | POA: Diagnosis not present

## 2020-05-10 DIAGNOSIS — M810 Age-related osteoporosis without current pathological fracture: Secondary | ICD-10-CM | POA: Diagnosis not present

## 2020-05-20 DIAGNOSIS — C44311 Basal cell carcinoma of skin of nose: Secondary | ICD-10-CM | POA: Diagnosis not present

## 2020-05-20 DIAGNOSIS — Z85828 Personal history of other malignant neoplasm of skin: Secondary | ICD-10-CM | POA: Diagnosis not present

## 2020-05-21 DIAGNOSIS — K5900 Constipation, unspecified: Secondary | ICD-10-CM | POA: Diagnosis not present

## 2020-05-21 DIAGNOSIS — M8580 Other specified disorders of bone density and structure, unspecified site: Secondary | ICD-10-CM | POA: Diagnosis not present

## 2020-05-21 DIAGNOSIS — Z23 Encounter for immunization: Secondary | ICD-10-CM | POA: Diagnosis not present

## 2020-05-21 DIAGNOSIS — N959 Unspecified menopausal and perimenopausal disorder: Secondary | ICD-10-CM | POA: Diagnosis not present

## 2020-05-21 DIAGNOSIS — R7989 Other specified abnormal findings of blood chemistry: Secondary | ICD-10-CM | POA: Diagnosis not present

## 2020-05-21 DIAGNOSIS — R300 Dysuria: Secondary | ICD-10-CM | POA: Diagnosis not present

## 2020-05-21 DIAGNOSIS — M545 Low back pain, unspecified: Secondary | ICD-10-CM | POA: Diagnosis not present

## 2020-06-03 DIAGNOSIS — Z23 Encounter for immunization: Secondary | ICD-10-CM | POA: Diagnosis not present

## 2020-07-16 DIAGNOSIS — M8580 Other specified disorders of bone density and structure, unspecified site: Secondary | ICD-10-CM | POA: Diagnosis not present

## 2020-07-16 DIAGNOSIS — N959 Unspecified menopausal and perimenopausal disorder: Secondary | ICD-10-CM | POA: Diagnosis not present

## 2020-08-03 DIAGNOSIS — Z961 Presence of intraocular lens: Secondary | ICD-10-CM | POA: Diagnosis not present

## 2020-08-03 DIAGNOSIS — H35372 Puckering of macula, left eye: Secondary | ICD-10-CM | POA: Diagnosis not present

## 2020-08-03 DIAGNOSIS — H52203 Unspecified astigmatism, bilateral: Secondary | ICD-10-CM | POA: Diagnosis not present

## 2020-11-16 HISTORY — PX: PARATHYROIDECTOMY: SHX19

## 2020-12-13 IMAGING — MG DIGITAL SCREENING BILATERAL MAMMOGRAM WITH TOMO AND CAD
8 series · 8 of 24 positions shown · non-contrast
Comparison: Previous exam(s).

CLINICAL DATA: Screening.

EXAM:
DIGITAL SCREENING BILATERAL MAMMOGRAM WITH TOMO AND CAD

[L CC synth-2D]
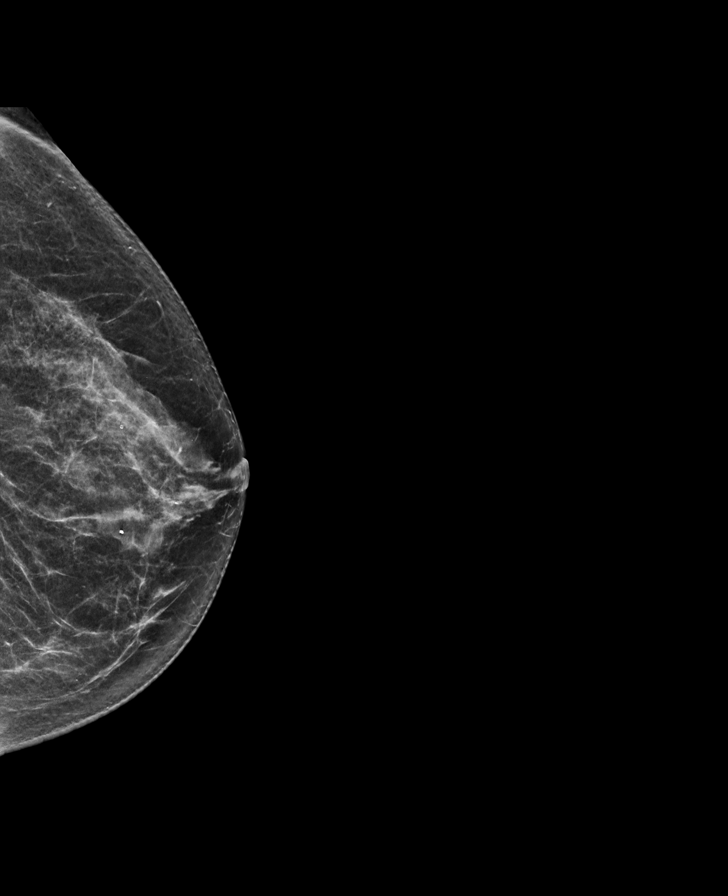

[R MLO synth-2D]
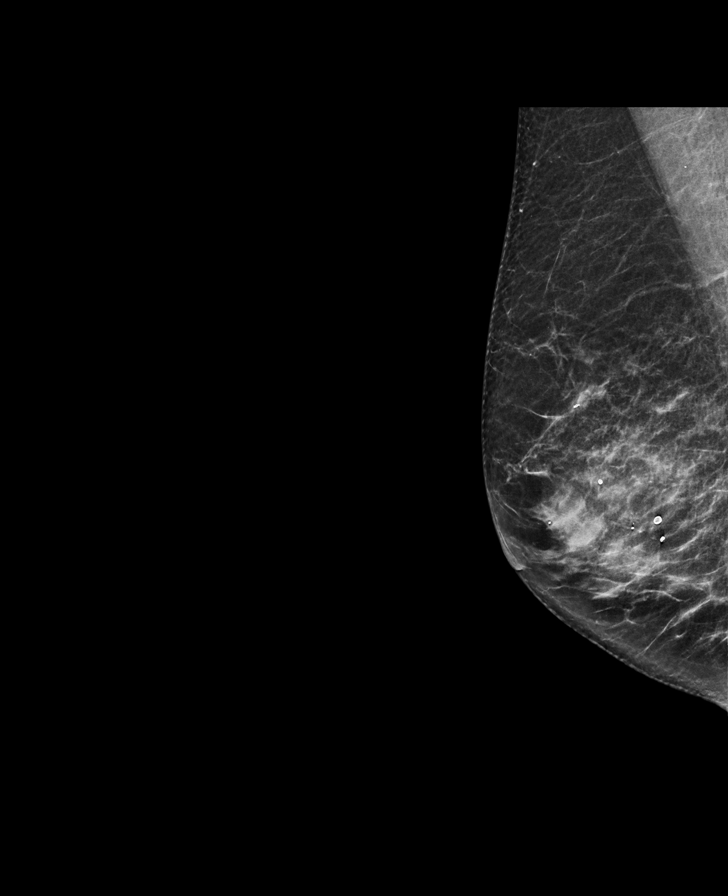

[R CC synth-2D]
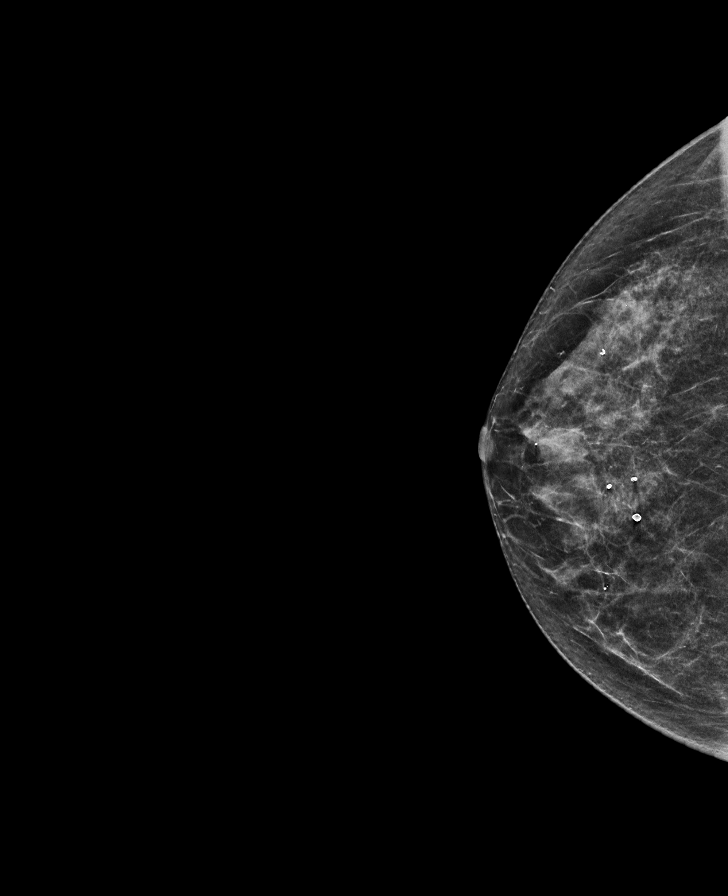

[L MLO synth-2D]
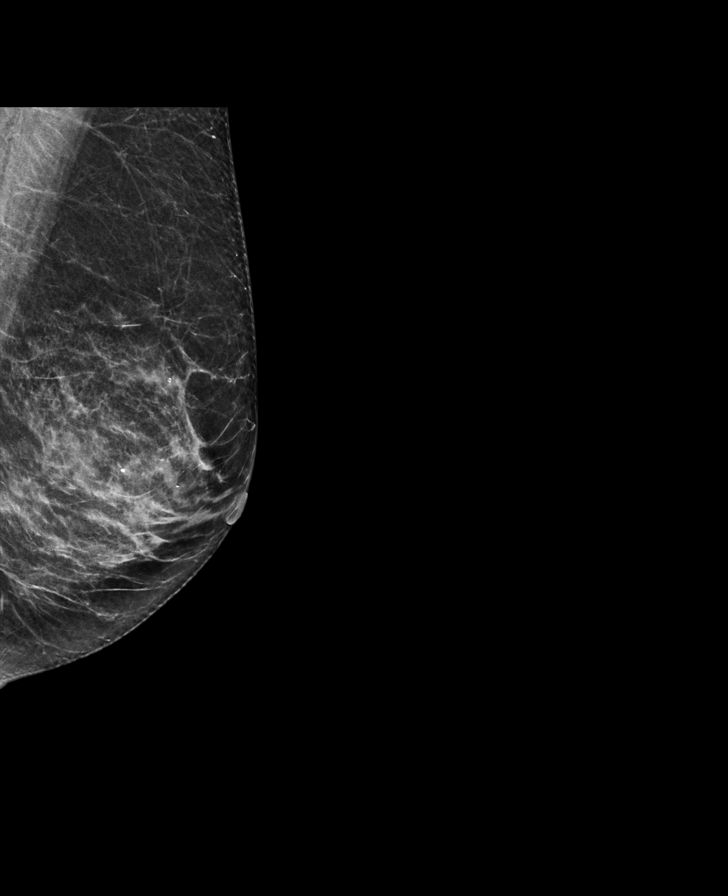

[R CC tomo · tomo slice 31/61.0]
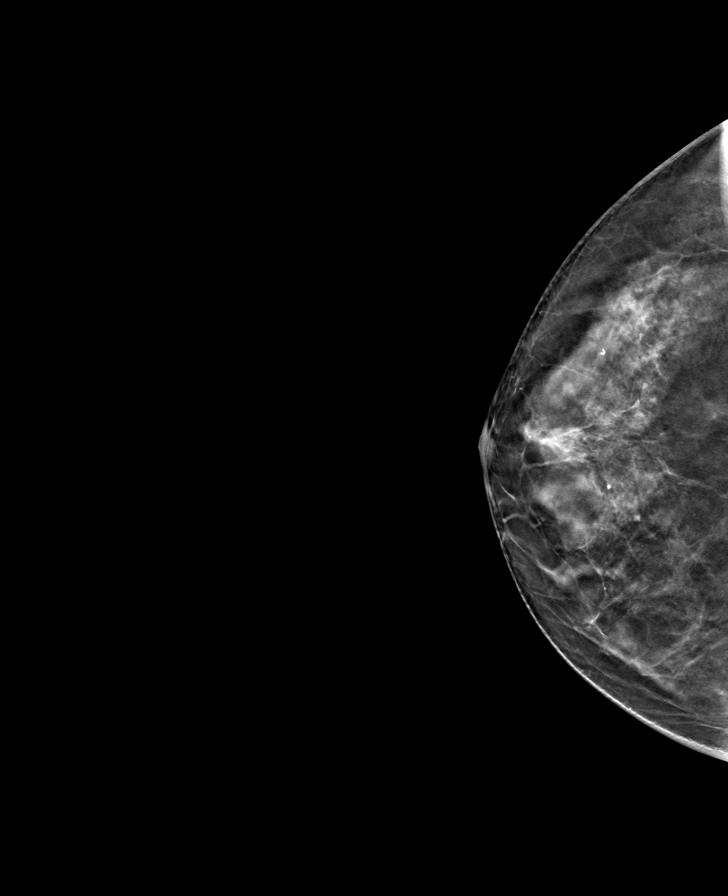

[L CC tomo · tomo slice 35/69.0]
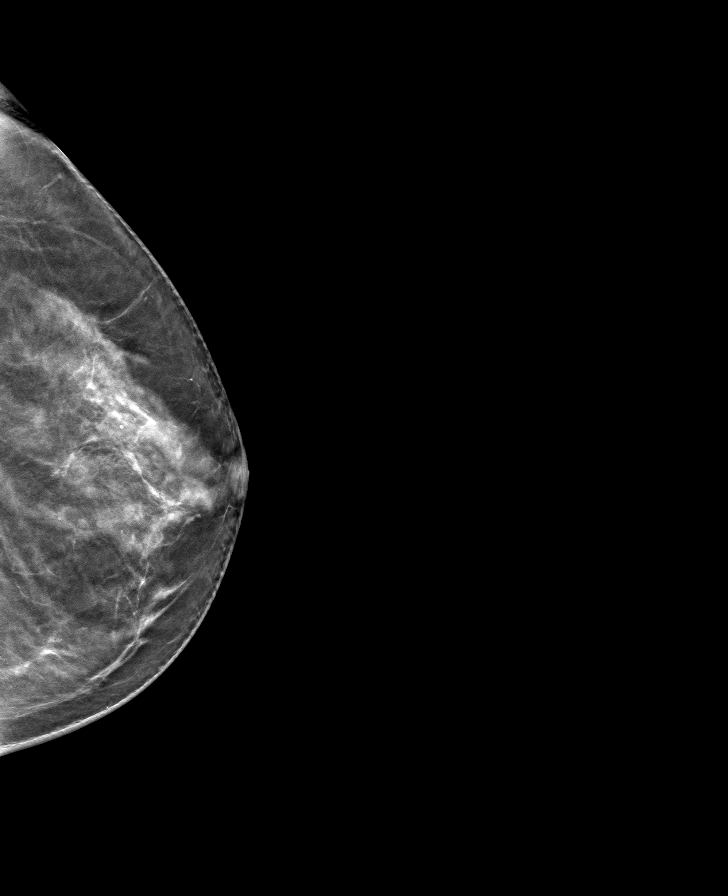

[L MLO tomo · tomo slice 31/62.0]
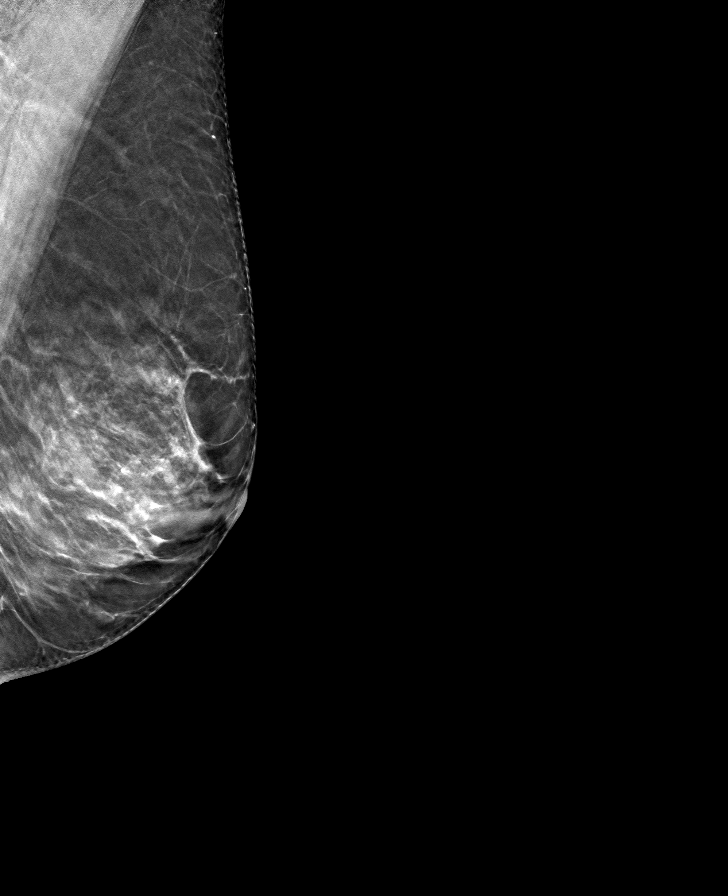

[R MLO tomo · tomo slice 31/62.0]
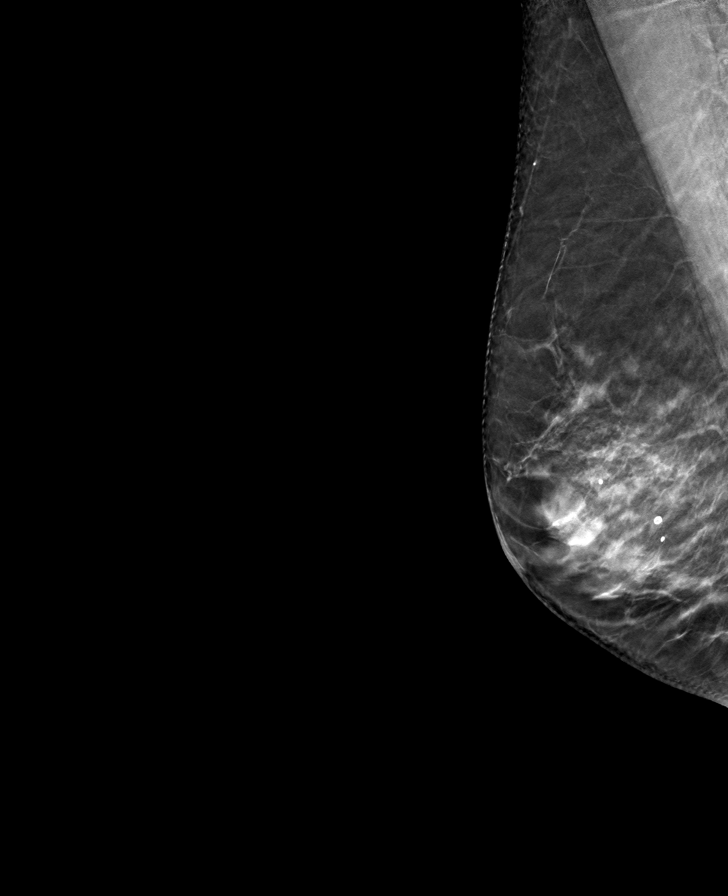

[8 of 24 positions shown; findings below may reference images not displayed]

ACR Breast Density Category b: There are scattered areas of
fibroglandular density.
FINDINGS: There are no findings suspicious for malignancy. Images were
processed with CAD.
IMPRESSION: No mammographic evidence of malignancy. A result letter of this
screening mammogram will be mailed directly to the patient.

RECOMMENDATION:
Screening mammogram in one year. (Code:CN-U-775)

BI-RADS CATEGORY  1: Negative.

## 2021-01-30 ENCOUNTER — Ambulatory Visit (HOSPITAL_COMMUNITY)
Admission: RE | Admit: 2021-01-30 | Discharge: 2021-01-30 | Disposition: A | Discharge status: Home / Self Care | Payer: Medicare Other | Source: Ambulatory Visit | Attending: Physician Assistant | Admitting: Physician Assistant

## 2021-01-30 ENCOUNTER — Encounter (HOSPITAL_COMMUNITY): Payer: Self-pay

## 2021-01-30 ENCOUNTER — Other Ambulatory Visit: Payer: Self-pay

## 2021-01-30 VITALS — BP 117/61 | HR 64 | Temp 98.9°F

## 2021-01-30 DIAGNOSIS — J40 Bronchitis, not specified as acute or chronic: Secondary | ICD-10-CM

## 2021-01-30 DIAGNOSIS — R059 Cough, unspecified: Secondary | ICD-10-CM

## 2021-01-30 DIAGNOSIS — J329 Chronic sinusitis, unspecified: Secondary | ICD-10-CM

## 2021-01-30 MED ORDER — DOXYCYCLINE HYCLATE 100 MG PO CAPS: 100.0000 mg | ORAL_CAPSULE | Freq: Two times a day (BID) | ORAL | 0 refills | Status: AC

## 2021-01-30 MED ORDER — PREDNISONE 20 MG PO TABS: 20.0000 mg | ORAL_TABLET | Freq: Every day | ORAL | 0 refills | Status: AC

## 2021-01-30 MED ORDER — BENZONATATE 100 MG PO CAPS: 100.0000 mg | ORAL_CAPSULE | Freq: Three times a day (TID) | ORAL | 0 refills | Status: DC

## 2021-01-30 NOTE — Discharge Instructions (Addendum)
Take doxycycline twice daily for 10 days to cover for sinus infection.  This medication can make you sensitive to the sun so please do not drive or drink alcohol while taking it.  Take prednisone 20 mg daily for 4 days.  Do not take NSAIDs including aspirin, ibuprofen/Advil, naproxen/Aleve with this medication due to risk of GI bleeding.  Use Tessalon for cough.  Continue with Mucinex, Flonase, over-the-counter antihistamine such as Claritin or Zyrtec as needed for additional symptom relief.  Make sure you are drinking plenty of fluid.  If you have any worsening symptoms please return for reevaluation.

## 2021-01-30 NOTE — ED Provider Notes (Signed)
[facility]    CSN: [id number] Arrival date & time: 01/30/21  1654      History   Chief Complaint Chief Complaint  Patient presents with   Cough    HPI Hannah Salazar is a 70 y.o. female.   Patient presents today with 3-week history of productive cough.  She reports associated nasal congestion and sinus pressure.  She denies any fever, chest pain, shortness of breath, nausea, vomiting, headache, dizziness.  She denies any recent antibiotic use.  Denies any known sick contacts.  Reports she is up-to-date on COVID-19 and influenza vaccine; has had both COVID boosters.  She does have a history of allergies managed with over-the-counter antihistamines and reports taking medication as prescribed.  She denies history of asthma, COPD, smoking.  She did take an at-home COVID test when her symptoms first began that was negative.   History reviewed. No pertinent past medical history.  There are no problems to display for this patient.   Past Surgical History:  Procedure Laterality Date   ABDOMINAL HYSTERECTOMY     BACK SURGERY      OB History   No obstetric history on file.      Home Medications    Prior to Admission medications   Medication Sig Start Date End Date Taking? Authorizing Provider  benzonatate (TESSALON) 100 MG capsule Take 1 capsule (100 mg total) by mouth every 8 (eight) hours. 01/30/21  Yes Ola Raap K, PA-C  doxycycline (VIBRAMYCIN) 100 MG capsule Take 1 capsule (100 mg total) by mouth 2 (two) times daily for 7 days. 01/30/21 02/06/21 Yes Marbella Markgraf, Junie Panning K, PA-C  predniSONE (DELTASONE) 20 MG tablet Take 1 tablet (20 mg total) by mouth daily for 4 days. 01/30/21 02/03/21 Yes June Vacha K, PA-C  baclofen (LIORESAL) 10 MG tablet Take 0.5 tablets (5 mg total) by mouth 2 (two) times daily as needed for muscle spasms. 03/20/20   Rosemarie Ax, MD  cetirizine (ZYRTEC) 10 MG chewable tablet Chew 10 mg by mouth daily.    [provider]  loratadine  (CLARITIN) 10 MG tablet Take 10 mg by mouth daily.    [provider]  MYRBETRIQ 50 MG TB24 tablet Take 50 mg by mouth daily. 12/27/17   [provider]    Family History Family History  Problem Relation Age of Onset   Breast cancer Neg Hx     Social History Social History   Tobacco Use   Smoking status: Never   Smokeless tobacco: Never  Vaping Use   Vaping Use: Never used  Substance Use Topics   Alcohol use: Yes   Drug use: Not Currently     Allergies   Penicillins   Review of Systems Review of Systems  Constitutional:  Negative for activity change, appetite change, fatigue and fever.  HENT:  Positive for congestion and sinus pressure. Negative for sneezing and sore throat.   Respiratory:  Positive for cough. Negative for shortness of breath.   Cardiovascular:  Negative for chest pain.  Gastrointestinal:  Negative for abdominal pain, diarrhea, nausea and vomiting.  Musculoskeletal:  Negative for arthralgias and myalgias.  Neurological:  Negative for dizziness, light-headedness and headaches.    Physical Exam Triage Vital Signs ED Triage Vitals  Enc Vitals Group     BP 01/30/21 1809 117/61     Pulse Rate 01/30/21 1809 64     Resp --      Temp 01/30/21 1809 98.9 F (37.2 C)  Temp Source 01/30/21 1809 Oral     SpO2 01/30/21 1809 100 %     Weight --      Height --      Head Circumference --      Peak Flow --      Pain Score 01/30/21 1808 3     Pain Loc --      Pain Edu? --      Excl. in [place]? --    No data found.  Updated Vital Signs BP 117/61 (BP Location: Right Arm)   Pulse 64   Temp 98.9 F (37.2 C) (Oral)   SpO2 100%   Visual Acuity Right Eye Distance:   Left Eye Distance:   Bilateral Distance:    Right Eye Near:   Left Eye Near:    Bilateral Near:     Physical Exam Vitals reviewed.  Constitutional:      General: She is awake. She is not in acute distress.    Appearance: Normal appearance. She is normal weight. She is  not ill-appearing.     Comments: Very pleasant female appears stated age in no acute distress  HENT:     Head: Normocephalic and atraumatic.     Right Ear: Tympanic membrane, ear canal and external ear normal. Tympanic membrane is not erythematous or bulging.     Left Ear: Tympanic membrane, ear canal and external ear normal. Tympanic membrane is not erythematous or bulging.     Nose:     Right Sinus: Maxillary sinus tenderness present. No frontal sinus tenderness.     Left Sinus: Maxillary sinus tenderness present. No frontal sinus tenderness.     Mouth/Throat:     Pharynx: Uvula midline. Posterior oropharyngeal erythema present. No oropharyngeal exudate.     Comments: Drainage and erythema in posterior oropharynx. Cardiovascular:     Rate and Rhythm: Normal rate and regular rhythm.     Heart sounds: Normal heart sounds, S1 normal and S2 normal. No murmur heard. Pulmonary:     Effort: Pulmonary effort is normal.     Breath sounds: Normal breath sounds. No wheezing, rhonchi or rales.     Comments: Reactive cough with deep breathing. Lymphadenopathy:     Head:     Right side of head: No submental, submandibular or tonsillar adenopathy.     Left side of head: No submental, submandibular or tonsillar adenopathy.     Cervical: No cervical adenopathy.  Psychiatric:        Behavior: Behavior is cooperative.     UC Treatments / Results  Labs (all labs ordered are listed, but only abnormal results are displayed) Labs Reviewed - No data to display  EKG   Radiology No results found.  Procedures Procedures (including critical care time)  Medications Ordered in UC Medications - No data to display  Initial Impression / Assessment and Plan / UC Course  I have reviewed the triage vital signs and the nursing notes.  Pertinent labs & imaging results that were available during my care of the patient were reviewed by me and considered in my medical decision making (see chart for  details).      Patient started on doxycycline twice daily for 7 days given prolonged and worsening symptoms.  She was instructed to avoid prolonged sun exposure due to photosensitivity associated with this medication.  She was prescribed prednisone burst (20 mg x 4 days) with instruction not to take NSAIDs with this medication due to risk of GI bleeding.  She  was prescribed Tessalon to be used twice daily as needed.  Encouraged her to use over-the-counter medications including antihistamine, Mucinex, Flonase for symptom relief.  Recommend she rest and drink plenty of fluid.  Strict return precautions given to which patient expressed understanding.  Final Clinical Impressions(s) / UC Diagnoses   Final diagnoses:  Sinobronchitis  Cough     Discharge Instructions      Take doxycycline twice daily for 10 days to cover for sinus infection.  This medication can make you sensitive to the sun so please do not drive or drink alcohol while taking it.  Take prednisone 20 mg daily for 4 days.  Do not take NSAIDs including aspirin, ibuprofen/Advil, naproxen/Aleve with this medication due to risk of GI bleeding.  Use Tessalon for cough.  Continue with Mucinex, Flonase, over-the-counter antihistamine such as Claritin or Zyrtec as needed for additional symptom relief.  Make sure you are drinking plenty of fluid.  If you have any worsening symptoms please return for reevaluation.     ED Prescriptions     Medication Sig Dispense Auth. Provider   doxycycline (VIBRAMYCIN) 100 MG capsule Take 1 capsule (100 mg total) by mouth 2 (two) times daily for 7 days. 14 capsule Zebastian Carico K, PA-C   benzonatate (TESSALON) 100 MG capsule Take 1 capsule (100 mg total) by mouth every 8 (eight) hours. 21 capsule Joyce Heitman K, PA-C   predniSONE (DELTASONE) 20 MG tablet Take 1 tablet (20 mg total) by mouth daily for 4 days. 4 tablet Yamilka Lopiccolo, Derry Skill, PA-C      PDMP not reviewed this encounter.   Terrilee Croak,  PA-C 01/30/21 1832

## 2021-01-30 NOTE — ED Triage Notes (Signed)
Pt c/o chest pain when coughing x 3 weeks. Pt states it has gotten worse in the last couple of days.

## 2021-04-04 ENCOUNTER — Other Ambulatory Visit: Payer: Self-pay | Admitting: Orthopedic Surgery

## 2021-04-19 ENCOUNTER — Encounter (HOSPITAL_BASED_OUTPATIENT_CLINIC_OR_DEPARTMENT_OTHER): Payer: Self-pay | Admitting: Orthopedic Surgery

## 2021-04-19 ENCOUNTER — Other Ambulatory Visit: Payer: Self-pay

## 2021-04-21 NOTE — Progress Notes (Signed)

## 2021-04-26 ENCOUNTER — Encounter (HOSPITAL_BASED_OUTPATIENT_CLINIC_OR_DEPARTMENT_OTHER)
Admission: RE | Disposition: A | Discharge status: Home / Self Care | Payer: Self-pay | Source: Home / Self Care | Attending: Orthopedic Surgery

## 2021-04-26 ENCOUNTER — Other Ambulatory Visit: Payer: Self-pay

## 2021-04-26 ENCOUNTER — Ambulatory Visit (HOSPITAL_BASED_OUTPATIENT_CLINIC_OR_DEPARTMENT_OTHER)
Admission: RE | Admit: 2021-04-26 | Discharge: 2021-04-26 | Disposition: A | Discharge status: Home / Self Care | Payer: Medicare Other | Attending: Orthopedic Surgery | Admitting: Orthopedic Surgery

## 2021-04-26 ENCOUNTER — Ambulatory Visit (HOSPITAL_BASED_OUTPATIENT_CLINIC_OR_DEPARTMENT_OTHER): Payer: Medicare Other | Admitting: Anesthesiology

## 2021-04-26 ENCOUNTER — Encounter (HOSPITAL_BASED_OUTPATIENT_CLINIC_OR_DEPARTMENT_OTHER): Payer: Self-pay | Admitting: Orthopedic Surgery

## 2021-04-26 DIAGNOSIS — R2231 Localized swelling, mass and lump, right upper limb: Secondary | ICD-10-CM | POA: Diagnosis present

## 2021-04-26 DIAGNOSIS — M19041 Primary osteoarthritis, right hand: Secondary | ICD-10-CM | POA: Diagnosis not present

## 2021-04-26 DIAGNOSIS — Z888 Allergy status to other drugs, medicaments and biological substances status: Secondary | ICD-10-CM | POA: Insufficient documentation

## 2021-04-26 DIAGNOSIS — Z79899 Other long term (current) drug therapy: Secondary | ICD-10-CM | POA: Diagnosis not present

## 2021-04-26 DIAGNOSIS — M25741 Osteophyte, right hand: Secondary | ICD-10-CM | POA: Diagnosis not present

## 2021-04-26 DIAGNOSIS — M67441 Ganglion, right hand: Secondary | ICD-10-CM | POA: Diagnosis not present

## 2021-04-26 HISTORY — PX: MASS EXCISION: SHX2000

## 2021-04-26 HISTORY — DX: Methicillin resistant Staphylococcus aureus infection, unspecified site: A49.02

## 2021-04-26 SURGERY — EXCISION MASS
Anesthesia: Monitor Anesthesia Care | Site: Finger | Laterality: Right

## 2021-04-26 MED ORDER — CLINDAMYCIN PHOSPHATE 900 MG/50ML IV SOLN
INTRAVENOUS | Status: AC
  Filled 2021-04-26: qty 50

## 2021-04-26 MED ORDER — BUPIVACAINE HCL (PF) 0.25 % IJ SOLN
INTRAMUSCULAR | Status: DC | PRN
  Administered 2021-04-26: 9 mL

## 2021-04-26 MED ORDER — FENTANYL CITRATE (PF) 100 MCG/2ML IJ SOLN: 25.0000 ug | INTRAMUSCULAR | Status: DC | PRN

## 2021-04-26 MED ORDER — ONDANSETRON HCL 4 MG/2ML IJ SOLN: 4.0000 mg | Freq: Once | INTRAMUSCULAR | Status: DC | PRN

## 2021-04-26 MED ORDER — CLINDAMYCIN PHOSPHATE 900 MG/50ML IV SOLN: 900.0000 mg | INTRAVENOUS | Status: DC

## 2021-04-26 MED ORDER — ONDANSETRON HCL 4 MG/2ML IJ SOLN
INTRAMUSCULAR | Status: DC | PRN
  Administered 2021-04-26: 4 mg via INTRAVENOUS

## 2021-04-26 MED ORDER — FENTANYL CITRATE (PF) 100 MCG/2ML IJ SOLN
INTRAMUSCULAR | Status: AC
  Filled 2021-04-26: qty 2

## 2021-04-26 MED ORDER — LIDOCAINE HCL (PF) 0.5 % IJ SOLN
INTRAMUSCULAR | Status: DC | PRN
  Administered 2021-04-26: 30 mL via INTRAVENOUS

## 2021-04-26 MED ORDER — LACTATED RINGERS IV SOLN: INTRAVENOUS | Status: DC

## 2021-04-26 MED ORDER — CEFAZOLIN SODIUM-DEXTROSE 2-4 GM/100ML-% IV SOLN
INTRAVENOUS | Status: AC
  Filled 2021-04-26: qty 100

## 2021-04-26 MED ORDER — CEFAZOLIN SODIUM-DEXTROSE 2-3 GM-%(50ML) IV SOLR
INTRAVENOUS | Status: DC | PRN
  Administered 2021-04-26: 2 g via INTRAVENOUS

## 2021-04-26 MED ORDER — TRAMADOL HCL 50 MG PO TABS
50.0000 mg | ORAL_TABLET | Freq: Four times a day (QID) | ORAL | 0 refills | Status: AC | PRN
Start: 2021-04-26 — End: ?

## 2021-04-26 MED ORDER — MIDAZOLAM HCL 5 MG/5ML IJ SOLN
INTRAMUSCULAR | Status: DC | PRN
  Administered 2021-04-26: 2 mg via INTRAVENOUS

## 2021-04-26 MED ORDER — 0.9 % SODIUM CHLORIDE (POUR BTL) OPTIME
TOPICAL | Status: DC | PRN
  Administered 2021-04-26: 50 mL

## 2021-04-26 MED ORDER — PROPOFOL 500 MG/50ML IV EMUL
INTRAVENOUS | Status: DC | PRN
  Administered 2021-04-26: 50 ug/kg/min via INTRAVENOUS

## 2021-04-26 MED ORDER — FENTANYL CITRATE (PF) 100 MCG/2ML IJ SOLN
INTRAMUSCULAR | Status: DC | PRN
  Administered 2021-04-26: 100 ug via INTRAVENOUS

## 2021-04-26 MED ORDER — MIDAZOLAM HCL 2 MG/2ML IJ SOLN
INTRAMUSCULAR | Status: AC
  Filled 2021-04-26: qty 2

## 2021-04-26 SURGICAL SUPPLY — 49 items
APL PRP STRL LF DISP 70% ISPRP (MISCELLANEOUS) ×2
BLADE SURG 15 STRL LF DISP TIS (BLADE) ×2 IMPLANT
BLADE SURG 15 STRL SS (BLADE) ×4
BNDG CMPR 9X4 STRL LF SNTH (GAUZE/BANDAGES/DRESSINGS)
BNDG COHESIVE 1X5 TAN STRL LF (GAUZE/BANDAGES/DRESSINGS) ×3 IMPLANT
BNDG COHESIVE 2X5 TAN ST LF (GAUZE/BANDAGES/DRESSINGS) IMPLANT
BNDG COHESIVE 3X5 TAN ST LF (GAUZE/BANDAGES/DRESSINGS) IMPLANT
BNDG ESMARK 4X9 LF (GAUZE/BANDAGES/DRESSINGS) IMPLANT
BNDG GAUZE ELAST 4 BULKY (GAUZE/BANDAGES/DRESSINGS) IMPLANT
CHLORAPREP W/TINT 26 (MISCELLANEOUS) ×4 IMPLANT
CORD BIPOLAR FORCEPS 12FT (ELECTRODE) ×4 IMPLANT
COVER BACK TABLE 60X90IN (DRAPES) ×4 IMPLANT
COVER MAYO STAND STRL (DRAPES) ×4 IMPLANT
CUFF TOURN SGL QUICK 18X4 (TOURNIQUET CUFF) ×4 IMPLANT
DECANTER SPIKE VIAL GLASS SM (MISCELLANEOUS) IMPLANT
DRAIN PENROSE 1/2X12 LTX STRL (WOUND CARE) IMPLANT
DRAPE EXTREMITY T 121X128X90 (DISPOSABLE) ×4 IMPLANT
DRAPE SURG 17X23 STRL (DRAPES) ×4 IMPLANT
GAUZE SPONGE 4X4 12PLY STRL (GAUZE/BANDAGES/DRESSINGS) ×4 IMPLANT
GAUZE XEROFORM 1X8 LF (GAUZE/BANDAGES/DRESSINGS) ×4 IMPLANT
GLOVE SRG 8 PF TXTR STRL LF DI (GLOVE) ×2 IMPLANT
GLOVE SURG ORTHO LTX SZ8 (GLOVE) ×4 IMPLANT
GLOVE SURG UNDER POLY LF SZ8 (GLOVE) ×8
GLOVE SURG UNDER POLY LF SZ8.5 (GLOVE) ×4 IMPLANT
GOWN STRL REUS W/ TWL LRG LVL3 (GOWN DISPOSABLE) ×1 IMPLANT
GOWN STRL REUS W/TWL 2XL LVL3 (GOWN DISPOSABLE) ×3 IMPLANT
GOWN STRL REUS W/TWL LRG LVL3 (GOWN DISPOSABLE)
GOWN STRL REUS W/TWL XL LVL3 (GOWN DISPOSABLE) ×4 IMPLANT
NDL PRECISIONGLIDE 27X1.5 (NEEDLE) ×1 IMPLANT
NDL SAFETY ECLIPSE 18X1.5 (NEEDLE) IMPLANT
NEEDLE HYPO 18GX1.5 SHARP (NEEDLE)
NEEDLE PRECISIONGLIDE 27X1.5 (NEEDLE) ×4 IMPLANT
NS IRRIG 1000ML POUR BTL (IV SOLUTION) ×4 IMPLANT
PACK BASIN DAY SURGERY FS (CUSTOM PROCEDURE TRAY) ×4 IMPLANT
PAD CAST 3X4 CTTN HI CHSV (CAST SUPPLIES) IMPLANT
PADDING CAST ABS 4INX4YD NS (CAST SUPPLIES) ×2
PADDING CAST ABS COTTON 4X4 ST (CAST SUPPLIES) ×2 IMPLANT
PADDING CAST COTTON 3X4 STRL (CAST SUPPLIES)
SPLINT FINGER 3.25 BULB 911905 (SOFTGOODS) ×3 IMPLANT
SPLINT PLASTER CAST XFAST 3X15 (CAST SUPPLIES) IMPLANT
SPLINT PLASTER XTRA FASTSET 3X (CAST SUPPLIES)
STOCKINETTE 4X48 STRL (DRAPES) ×4 IMPLANT
SUT ETHILON 4 0 PS 2 18 (SUTURE) ×4 IMPLANT
SUT VIC AB 4-0 P2 18 (SUTURE) IMPLANT
SUT VICRYL 4-0 PS2 18IN ABS (SUTURE) IMPLANT
SYR BULB EAR ULCER 3OZ GRN STR (SYRINGE) ×4 IMPLANT
SYR CONTROL 10ML LL (SYRINGE) ×4 IMPLANT
TOWEL GREEN STERILE FF (TOWEL DISPOSABLE) ×4 IMPLANT
UNDERPAD 30X36 HEAVY ABSORB (UNDERPADS AND DIAPERS) ×4 IMPLANT

## 2021-04-26 NOTE — Brief Op Note (Signed)
04/26/2021  1:52 PM  PATIENT:  Hannah Salazar  70 y.o. 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  PROCEDURE:  Procedure(s): EXCISION CYST DEBRIDEMENT DISTAL INTERPHALANGEAL JOINT (Right)  SURGEON:  Surgeon(s) and Role:    * Daryll Brod, MD - Primary  PHYSICIAN ASSISTANT:   ASSISTANTS: none   ANESTHESIA:   local, regional, and IV sedation  EBL:  5 mL   BLOOD ADMINISTERED:none  DRAINS: none   LOCAL MEDICATIONS USED:  BUPIVICAINE   SPECIMEN:  Excision  DISPOSITION OF SPECIMEN:  PATHOLOGY  COUNTS:  YES  TOURNIQUET:   Total Tourniquet Time Documented: Forearm (Right) - 29 minutes Total: Forearm (Right) - 29 minutes   DICTATION: .Viviann Spare Dictation  PLAN OF CARE: Discharge to home after PACU  PATIENT DISPOSITION:  PACU - hemodynamically stable.

## 2021-04-26 NOTE — Transfer of Care (Signed)
Immediate Anesthesia Transfer of Care Note  Patient: Hannah Salazar  Procedure(s) Performed: EXCISION CYST DEBRIDEMENT DISTAL INTERPHALANGEAL JOINT (Right: Finger)  Patient Location: PACU  Anesthesia Type:MAC and Bier block  Level of Consciousness: awake, alert  and oriented  Airway & Oxygen Therapy: Patient Spontanous Breathing and Patient connected to face mask oxygen  Post-op Assessment: Report given to RN and Post -op Vital signs reviewed and stable  Post vital signs: Reviewed and stable  Last Vitals:  Vitals Value Taken Time  BP    Temp    Pulse    Resp    SpO2      Last Pain:  Vitals:   04/26/21 1200  TempSrc: Oral  PainSc: 0-No pain      Patients Stated Pain Goal: 0 (62/03/55 9741)  Complications: No notable events documented.

## 2021-04-26 NOTE — Anesthesia Postprocedure Evaluation (Signed)
Anesthesia Post Note  Patient: TARIA CASTRILLO  Procedure(s) Performed: EXCISION CYST DEBRIDEMENT DISTAL INTERPHALANGEAL JOINT (Right: Finger)     Patient location during evaluation: PACU Anesthesia Type: MAC and Bier Block Level of consciousness: awake and alert Pain management: pain level controlled Vital Signs Assessment: post-procedure vital signs reviewed and stable Respiratory status: spontaneous breathing, nonlabored ventilation and respiratory function stable Cardiovascular status: stable and blood pressure returned to baseline Postop Assessment: no apparent nausea or vomiting Anesthetic complications: no   No notable events documented.  Last Vitals:  Vitals:   04/26/21 1415 04/26/21 1448  BP: 120/73 105/60  Pulse: (!) 54 (!) 55  Resp: 14 16  Temp:  36.5 C  SpO2: 96% 96%    Last Pain:  Vitals:   04/26/21 1448  TempSrc:   PainSc: 0-No pain                 Catalina Gravel

## 2021-04-26 NOTE — H&P (Signed)
Hannah Salazar is an 70 y.o. female.   Chief Complaint: Mass index finger right hand HPI: Hannah Salazar is a 70 year old right-hand-dominant female complaining of a mass on her right index finger distal phalangeal joint. Is been present for several months. She recalls no history of injury. It is not causing her any pain or discomfort. She has not had any treatment for this. It does not seem to get better or worse but does seem to be enlarging. She has no history of diabetes arthritis or gout. She has had a history of parathyroid problems and has had treatment for this. Family history is positive arthritis and gout.   Past Medical History:  Diagnosis Date   MRSA infection    over 20 years ago on her chin    Past Surgical History:  Procedure Laterality Date   ABDOMINAL HYSTERECTOMY     BACK SURGERY     PARATHYROIDECTOMY Right 11/16/2020   at [facility] History  Problem Relation Age of Onset   Breast cancer Neg Hx    Social History:  reports that she has never smoked. She has never used smokeless tobacco. She reports current alcohol use of about 5.0 - 6.0 standard drinks per week. She reports that she does not currently use drugs.  Allergies:  Allergies  Allergen Reactions   Alendronate Other (See Comments)    Severe constipation   Risedronate Other (See Comments)    Severe constipation    Medications Prior to Admission  Medication Sig Dispense Refill   calcium carbonate (OSCAL) 1500 (600 Ca) MG TABS tablet Take by mouth 2 (two) times daily with a meal.     Cholecalciferol (VITAMIN D3) 25 MCG (1000 UT) CAPS Take by mouth.     Multiple Vitamin (MULTIVITAMIN WITH MINERALS) TABS tablet Take 1 tablet by mouth daily.     MYRBETRIQ 50 MG TB24 tablet Take 50 mg by mouth daily.  1   polyethylene glycol (MIRALAX / GLYCOLAX) 17 g packet Take 17 g by mouth daily.      No results found for this or any previous visit (from the past 48 hour(s)).  No results found.   Pertinent items are noted in  HPI.  Blood pressure 100/66, pulse (!) 57, temperature 97.9 F (36.6 C), temperature source Oral, resp. rate 18, height '5\' 10"'$  (1.778 m), weight 73.1 kg, SpO2 100 %.  General appearance: alert, cooperative, and appears stated age Head: Normocephalic, without obvious abnormality Neck: no JVD Resp: clear to auscultation bilaterally Cardio: regular rate and rhythm, S1, S2 normal, no murmur, click, rub or gallop GI: soft, non-tender; bowel sounds normal; no masses,  no organomegaly Extremities:  Mass cyst DIP joint right index finger Pulses: 2+ and symmetric Skin: Skin color, texture, turgor normal. 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 Mucoid cyst, joint    Plan: We have discussed with her the etiology of the problem. We have discussed excision of the cyst debridement of the joint with synovectomy possible flap rotation. Preperi-and postoperative course been discussed along with risk and complications. She is aware there is no guarantee to the surgery the possibility of infection recurrence injury to arteries nerves tendons incomplete relief symptoms dystrophy. She is scheduled for excision excision mucoid cyst debridement distal phalangeal joint possible flap rotation right index finger as an outpatient under regional anesthesia.   Daryll Brod 04/26/2021, 12:43 PM

## 2021-04-26 NOTE — Discharge Instructions (Addendum)

## 2021-04-26 NOTE — Anesthesia Preprocedure Evaluation (Addendum)
Anesthesia Evaluation  Patient identified by MRN, date of birth, ID band Patient awake    Reviewed: Allergy & Precautions, NPO status , Patient's Chart, lab work & pertinent test results  Airway Mallampati: III  TM Distance: >3 FB Neck ROM: Full    Dental  (+) Teeth Intact, Dental Advisory Given   Pulmonary neg pulmonary ROS,    Pulmonary exam normal breath sounds clear to auscultation       Cardiovascular Exercise Tolerance: Good negative cardio ROS Normal cardiovascular exam Rhythm:Regular Rate:Normal     Neuro/Psych negative neurological ROS     GI/Hepatic negative GI ROS, (+)     substance abuse  alcohol use,   Endo/Other  negative endocrine ROS  Renal/GU negative Renal ROS     Musculoskeletal 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  Reproductive/Obstetrics                            Anesthesia Physical Anesthesia Plan  ASA: 2  Anesthesia Plan: Bier Block and MAC and Bier Block-LIDOCAINE ONLY   Post-op Pain Management:    Induction: Intravenous  PONV Risk Score and Plan: 2 and Propofol infusion, Treatment may vary due to age or medical condition, Dexamethasone and Ondansetron  Airway Management Planned: Nasal Cannula and Natural Airway  Additional Equipment:   Intra-op Plan:   Post-operative Plan:   Informed Consent: I have reviewed the patients History and Physical, chart, labs and discussed the procedure including the risks, benefits and alternatives for the proposed anesthesia with the patient or authorized representative who has indicated his/her understanding and acceptance.     Dental advisory given  Plan Discussed with: CRNA  Anesthesia Plan Comments:         Anesthesia Quick Evaluation

## 2021-04-26 NOTE — Op Note (Signed)
NAME: Hannah Salazar RECORD NO: [id number] DATE OF BIRTH: Jan 01, 1951 FACILITY[facility] LOCATION: [facility] PHYSICIAN: Wynonia Sours, MD   OPERATIVE REPORT   DATE OF PROCEDURE: 04/26/21    PREOPERATIVE DIAGNOSIS: Mucoid cyst degenerative arthritis right index finger distal phalangeal joint   POSTOPERATIVE DIAGNOSIS: Same   PROCEDURE: Excision mucoid cyst with debridement osteophytes and synovectomy distal phalangeal joint right index finger   SURGEON: Daryll Brod, M.D.   ASSISTANT: none   ANESTHESIA:  Bier block with sedation and Local   INTRAVENOUS FLUIDS:  Per anesthesia flow sheet.   ESTIMATED BLOOD LOSS:  Minimal.   COMPLICATIONS:  None.   SPECIMENS: Cyst osteophyte synovial tissue   TOURNIQUET TIME:    Total Tourniquet Time Documented: Forearm (Right) - 29 minutes Total: Forearm (Right) - 29 minutes    DISPOSITION:  Stable to PACU.   INDICATIONS: Patient is a 70 year old female with a large mucoid cyst in the distal phalangeal joint right index finger.  This is transilluminate.  She is desires having this excised.  Pre-.  Postoperative course been discussed along with risks and complications.  She is aware there is no guarantee to the surgery the possibility of infection recurrence injury to arteries nerves tendons incomplete relief of symptoms dystrophy the possibility of stiffness in the joint.  Preoperative area the patient is seen extremity marked by both patient and surgeon antibiotic given  OPERATIVE COURSE: Patient was brought to the operating room placed in the supine position with the right arm free.  Prep was done with ChloraPrep after a forearm IV regional anesthetic was carried out without difficulty under the direction anesthesia department.  3-minute dry time was allowed and timeout taken confirming patient procedure.  A metacarpal block was given quarter percent bupivacaine without epinephrine 9 cc was used.  Curvilinear incision was  made over the dorsal aspect distal phalangeal joint carried down along the mid lateral line ulnar side of the index finger right hand.  This was done just proximal to the cyst.  The cyst was then isolated and the volar half was removed leaving the dorsal half in continuity with the skin.  The joint was opened more proximally.  This allowed the synovial tissue to be excised the volar half of the cyst was sent to pathology.  The joint was debrided of osteophytes especially on the ulnar aspect.  This was done with hemostatic rondure and house curette.  The entire specimen was sent to pathology.  It was copiously irrigated with saline.  Skin was closed interrupted 4-0 nylon sutures.  A sterile compressive dressing and AlumaFoam splint was applied.  Inflation of the tourniquet all for the remaining fingers pink.  She was taken to the recovery room for observation in satisfactory condition.  She will be discharged home return to the hand center [facility] in 1 week Tylenol ibuprofen for pain with Ultram for breakthrough.   Daryll Brod, MD Electronically signed, 04/26/21

## 2021-04-27 LAB — SURGICAL PATHOLOGY

## 2021-04-28 ENCOUNTER — Encounter (HOSPITAL_BASED_OUTPATIENT_CLINIC_OR_DEPARTMENT_OTHER): Payer: Self-pay | Admitting: Orthopedic Surgery

## 2023-12-08 ENCOUNTER — Encounter (HOSPITAL_BASED_OUTPATIENT_CLINIC_OR_DEPARTMENT_OTHER): Payer: Self-pay

## 2023-12-08 ENCOUNTER — Emergency Department (HOSPITAL_BASED_OUTPATIENT_CLINIC_OR_DEPARTMENT_OTHER)

## 2023-12-08 ENCOUNTER — Other Ambulatory Visit: Payer: Self-pay

## 2023-12-08 ENCOUNTER — Emergency Department (HOSPITAL_BASED_OUTPATIENT_CLINIC_OR_DEPARTMENT_OTHER): Admission: EM | Admit: 2023-12-08 | Discharge: 2023-12-08 | Disposition: A | Discharge status: Home / Self Care

## 2023-12-08 DIAGNOSIS — R519 Headache, unspecified: Secondary | ICD-10-CM | POA: Insufficient documentation

## 2023-12-08 DIAGNOSIS — W19XXXA Unspecified fall, initial encounter: Secondary | ICD-10-CM | POA: Diagnosis not present

## 2023-12-08 LAB — CBC WITH DIFFERENTIAL/PLATELET
Abs Immature Granulocytes: 0.01 10*3/uL (ref 0.00–0.07)
Basophils Absolute: 0.1 10*3/uL (ref 0.0–0.1)
Basophils Relative: 1 %
Eosinophils Absolute: 0.1 10*3/uL (ref 0.0–0.5)
Eosinophils Relative: 2 %
HCT: 37.1 % (ref 36.0–46.0)
Hemoglobin: 12.6 g/dL (ref 12.0–15.0)
Immature Granulocytes: 0 %
Lymphocytes Relative: 42 %
Lymphs Abs: 2.5 10*3/uL (ref 0.7–4.0)
MCH: 30.5 pg (ref 26.0–34.0)
MCHC: 34 g/dL (ref 30.0–36.0)
MCV: 89.8 fL (ref 80.0–100.0)
Monocytes Absolute: 0.5 10*3/uL (ref 0.1–1.0)
Monocytes Relative: 9 %
Neutro Abs: 2.7 10*3/uL (ref 1.7–7.7)
Neutrophils Relative %: 46 %
Platelets: 222 10*3/uL (ref 150–400)
RBC: 4.13 MIL/uL (ref 3.87–5.11)
RDW: 12.3 % (ref 11.5–15.5)
WBC: 5.9 10*3/uL (ref 4.0–10.5)
nRBC: 0 % (ref 0.0–0.2)

## 2023-12-08 LAB — BASIC METABOLIC PANEL WITH GFR
Anion gap: 12 (ref 5–15)
BUN: 19 mg/dL (ref 8–23)
CO2: 21 mmol/L — ABNORMAL LOW (ref 22–32)
Calcium: 9.6 mg/dL (ref 8.9–10.3)
Chloride: 105 mmol/L (ref 98–111)
Creatinine, Ser: 0.83 mg/dL (ref 0.44–1.00)
GFR, Estimated: >60 mL/min (ref >60)
Glucose, Bld: 88 mg/dL (ref 70–99)
Potassium: 4 mmol/L (ref 3.5–5.1)
Sodium: 138 mmol/L (ref 135–145)

## 2023-12-08 MED ORDER — IBUPROFEN 400 MG PO TABS
400.0000 mg | ORAL_TABLET | Freq: Once | ORAL | Status: AC | PRN
  Administered 2023-12-08: 400 mg via ORAL
  Filled 2023-12-08: qty 1

## 2023-12-08 NOTE — ED Triage Notes (Signed)
 Fell on 4/24 and hit her head. Headache was mild and has started worsening this afternoon.  Denies n/v/visual changes or confusion. No blood thinners.

## 2023-12-08 NOTE — ED Provider Notes (Signed)
 [place] EMERGENCY DEPARTMENT AT [facility] Provider Note   CSN: [id number] Arrival date & time: 12/08/23  1823     History  Chief Complaint  Patient presents with   Hannah Salazar is a 73 y.o. female.  This is a 73 year old female presenting emergency department for headache.  Hannah Salazar 4/24.  Unclear why she fell unsure if she syncopized, lost her balance.  She got up went to the bathroom at 1 AM was on the ground, tried to get up her husband notes that she fell backwards and hit head on furniture.  Has had mild headache since that time.  Worsened today after she traveled in car.  No vision changes, facial droop, unilateral weakness, numbness tingling changes in sensation.  No chest pain or shortness of breath.  No palpitations or near syncopal episodes since then   Fall       Home Medications Prior to Admission medications   Medication Sig Start Date End Date Taking? Authorizing Provider  calcium carbonate (OSCAL) 1500 (600 Ca) MG TABS tablet Take by mouth 2 (two) times daily with a meal.    [provider]  Cholecalciferol (VITAMIN D3) 25 MCG (1000 UT) CAPS Take by mouth.    [provider]  Multiple Vitamin (MULTIVITAMIN WITH MINERALS) TABS tablet Take 1 tablet by mouth daily.    [provider]  MYRBETRIQ 50 MG TB24 tablet Take 50 mg by mouth daily. 12/27/17   [provider]  polyethylene glycol (MIRALAX / GLYCOLAX) 17 g packet Take 17 g by mouth daily.    [provider]  traMADol  (ULTRAM ) 50 MG tablet Take 1 tablet (50 mg total) by mouth every 6 (six) hours as needed. 04/26/21   Lyanne Sample, MD      Allergies    Alendronate and Risedronate    Review of Systems   Review of Systems  Physical Exam Updated Vital Signs BP 116/68 (BP Location: Right Arm)   Pulse 63   Temp 97.9 F (36.6 C) (Oral)   Resp 18   SpO2 96%  Physical Exam Vitals and nursing note reviewed.  Constitutional:      General: She is not  in acute distress.    Appearance: She is not toxic-appearing.  HENT:     Head: Normocephalic.     Nose: Nose normal.     Mouth/Throat:     Mouth: Mucous membranes are moist.  Eyes:     Conjunctiva/sclera: Conjunctivae normal.  Cardiovascular:     Rate and Rhythm: Normal rate.  Pulmonary:     Effort: Pulmonary effort is normal.     Breath sounds: Normal breath sounds.  Abdominal:     General: Abdomen is flat. There is no distension.     Palpations: Abdomen is soft.     Tenderness: There is no abdominal tenderness. There is no guarding or rebound.  Musculoskeletal:        General: Normal range of motion.  Skin:    General: Skin is warm and dry.     Capillary Refill: Capillary refill takes less than 2 seconds.  Neurological:     General: No focal deficit present.     Mental Status: She is alert.  Psychiatric:        Mood and Affect: Mood normal.        Behavior: Behavior normal.     ED Results / Procedures / Treatments   Labs (all labs ordered are listed, but only abnormal  results are displayed) Labs Reviewed  BASIC METABOLIC PANEL WITH GFR - Abnormal; Notable for the following components:      Result Value   CO2 21 (*)    All other components within normal limits  CBC WITH DIFFERENTIAL/PLATELET    EKG None  Radiology CT Head Wo Contrast Result Date: 12/08/2023 CLINICAL DATA:  Initial evaluation for acute head trauma, minor. EXAM: CT HEAD WITHOUT CONTRAST TECHNIQUE: Contiguous axial images were obtained from the base of the skull through the vertex without intravenous contrast. RADIATION DOSE REDUCTION: This exam was performed according to the departmental dose-optimization program which includes automated exposure control, adjustment of the mA and/or kV according to patient size and/or use of iterative reconstruction technique. COMPARISON:  None Available. FINDINGS: Brain: Cerebral volume within normal limits for patient age. No acute intracranial hemorrhage. No acute  large vessel territory infarct. No mass lesion, midline shift, or mass effect. Ventricles are normal in size without hydrocephalus. No extra-axial fluid collection. Vascular: No abnormal hyperdense vessel. Skull: Scalp soft tissues demonstrate no acute abnormality. Calvarium intact. Sinuses/Orbits: Globes and orbital soft tissues within normal limits. Visualized paranasal sinuses are largely clear. No significant mastoid effusion. IMPRESSION: Normal head CT. No acute intracranial abnormality. Electronically Signed   By: Virgia Griffins M.D.   On: 12/08/2023 21:05    Procedures Procedures    Medications Ordered in ED Medications  ibuprofen (ADVIL) tablet 400 mg (400 mg Oral Given 12/08/23 1850)    ED Course/ Medical Decision Making/ A&P Clinical Course as of 12/08/23 2123  Sat Dec 08, 2023  2119 CT Head Wo Contrast MPRESSION: Normal head CT. No acute intracranial abnormality.   Electronically Signed   [TY]    Clinical Course User Index [TY] Rolinda Climes, DO                                 Medical Decision Making Is well-appearing 73 year old female presenting emergency department for headache after a fall on 4/24.  Mild headache that seemingly worsened today.  No neurodeficits on exam.  CT head without acute intracranial pathology.  Unclear if she syncopized.  EKG appears to be normal sinus rhythm without ST segment changes to get ischemia.  No significant metabolic derangements.  Awaiting CBC, but lower suspicion for anemia or acute pathology.  She has appointment with her primary doctor on Monday.  Discussed supportive care.  Will discharge with CBC normal.  Amount and/or Complexity of Data Reviewed Independent Historian:     Details: Husband notes patient did hit her head, does not think she lost consciousness after hitting her head. External Data Reviewed:     Details: Does not take blood thinner Labs: ordered. Decision-making details documented in ED  Course. Radiology: ordered and independent interpretation performed. Decision-making details documented in ED Course. ECG/medicine tests: ordered and independent interpretation performed.    Details: Appears to be sinus rhythm, albeit slightly bradycardic.  Normal intervals.  No ST segment changes to indicate ischemia.  Risk Prescription drug management. Decision regarding hospitalization. Diagnosis or treatment significantly limited by social determinants of health.          Final Clinical Impression(s) / ED Diagnoses Final diagnoses:  None    Rx / DC Orders ED Discharge Orders     None         Rolinda Climes, DO 12/08/23 2123

## 2023-12-08 NOTE — Discharge Instructions (Addendum)
 You may take over-the-counter medications for your headache such as Tylenol or ibuprofen.  You are immediately for fevers, chills, worsening headache, vision changes, facial droop, unilateral weakness or any new or worsening symptoms that are concerning to you.
